# Patient Record
Sex: Male | Born: 1955 | Race: Black or African American | Hispanic: No | Smoking: Current every day smoker
Health system: Southern US, Community
[De-identification: ages and names within clinical notes are randomized; demographics above are authoritative.]

## PROBLEM LIST (undated history)

## (undated) DIAGNOSIS — I10 Essential (primary) hypertension: Secondary | ICD-10-CM

## (undated) DIAGNOSIS — I639 Cerebral infarction, unspecified: Secondary | ICD-10-CM

---

## 2009-11-08 ENCOUNTER — Ambulatory Visit: Payer: Self-pay | Admitting: Cardiology

## 2009-11-08 ENCOUNTER — Inpatient Hospital Stay (HOSPITAL_COMMUNITY)
Admission: EM | Admit: 2009-11-08 | Discharge: 2009-11-10 | Payer: Self-pay | Source: Home / Self Care | Admitting: Emergency Medicine

## 2009-11-10 ENCOUNTER — Encounter (INDEPENDENT_AMBULATORY_CARE_PROVIDER_SITE_OTHER): Payer: Self-pay | Admitting: Internal Medicine

## 2010-08-30 LAB — DIFFERENTIAL
Basophils Absolute: 0.1 10*3/uL (ref 0.0–0.1)
Basophils Relative: 1 % (ref 0–1)
Basophils Relative: 1 % (ref 0–1)
Eosinophils Absolute: 0.1 10*3/uL (ref 0.0–0.7)
Eosinophils Relative: 1 % (ref 0–5)
Lymphocytes Relative: 27 % (ref 12–46)
Lymphs Abs: 3.1 10*3/uL (ref 0.7–4.0)
Monocytes Absolute: 0.5 10*3/uL (ref 0.1–1.0)
Monocytes Absolute: 0.6 10*3/uL (ref 0.1–1.0)
Monocytes Relative: 6 % (ref 3–12)
Neutrophils Relative %: 67 % (ref 43–77)
Neutrophils Relative %: 70 % (ref 43–77)

## 2010-08-30 LAB — CBC
HCT: 45.9 % (ref 39.0–52.0)
MCHC: 32.6 g/dL (ref 30.0–36.0)
MCHC: 33.2 g/dL (ref 30.0–36.0)
Platelets: 148 10*3/uL — ABNORMAL LOW (ref 150–400)
RBC: 5.21 MIL/uL (ref 4.22–5.81)
WBC: 11.7 10*3/uL — ABNORMAL HIGH (ref 4.0–10.5)
WBC: 8.8 10*3/uL (ref 4.0–10.5)

## 2010-08-30 LAB — BASIC METABOLIC PANEL
BUN: 14 mg/dL (ref 6–23)
BUN: 15 mg/dL (ref 6–23)
BUN: 16 mg/dL (ref 6–23)
CO2: 28 mEq/L (ref 19–32)
CO2: 29 mEq/L (ref 19–32)
CO2: 30 mEq/L (ref 19–32)
Calcium: 9.6 mg/dL (ref 8.4–10.5)
Calcium: 9.7 mg/dL (ref 8.4–10.5)
Chloride: 102 mEq/L (ref 96–112)
Chloride: 103 mEq/L (ref 96–112)
Chloride: 104 mEq/L (ref 96–112)
Creatinine, Ser: 1.42 mg/dL (ref 0.4–1.5)
Creatinine, Ser: 1.43 mg/dL (ref 0.4–1.5)
Creatinine, Ser: 1.55 mg/dL — ABNORMAL HIGH (ref 0.4–1.5)
GFR calc Af Amer: 57 mL/min — ABNORMAL LOW (ref >60)
GFR calc Af Amer: >60 mL/min (ref >60)
GFR calc Af Amer: >60 mL/min (ref >60)
GFR calc non Af Amer: 47 mL/min — ABNORMAL LOW (ref >60)
GFR calc non Af Amer: 51 mL/min — ABNORMAL LOW (ref >60)
GFR calc non Af Amer: 52 mL/min — ABNORMAL LOW (ref >60)
Glucose, Bld: 107 mg/dL — ABNORMAL HIGH (ref 70–99)
Glucose, Bld: 109 mg/dL — ABNORMAL HIGH (ref 70–99)
Glucose, Bld: 114 mg/dL — ABNORMAL HIGH (ref 70–99)
Potassium: 3.8 mEq/L (ref 3.5–5.1)
Potassium: 4 mEq/L (ref 3.5–5.1)
Sodium: 138 mEq/L (ref 135–145)
Sodium: 138 mEq/L (ref 135–145)

## 2010-08-30 LAB — CARDIAC PANEL(CRET KIN+CKTOT+MB+TROPI)
CK, MB: 1.6 ng/mL (ref 0.3–4.0)
Total CK: 168 U/L (ref 7–232)
Troponin I: 0.02 ng/mL (ref 0.00–0.06)

## 2010-08-30 LAB — LIPID PANEL
HDL: 34 mg/dL — ABNORMAL LOW (ref >39)
Total CHOL/HDL Ratio: 4.4 RATIO
Triglycerides: 166 mg/dL — ABNORMAL HIGH (ref <150)

## 2010-08-30 LAB — HOMOCYSTEINE: Homocysteine: 64 umol/L — ABNORMAL HIGH (ref 4.0–15.4)

## 2010-08-30 LAB — APTT: aPTT: 27 seconds (ref 24–37)

## 2013-09-16 ENCOUNTER — Other Ambulatory Visit (HOSPITAL_COMMUNITY): Payer: Self-pay | Admitting: Family Medicine

## 2013-09-16 DIAGNOSIS — Q539 Undescended testicle, unspecified: Secondary | ICD-10-CM

## 2013-09-19 ENCOUNTER — Other Ambulatory Visit (HOSPITAL_COMMUNITY): Payer: Self-pay | Admitting: Family Medicine

## 2013-09-19 ENCOUNTER — Ambulatory Visit (HOSPITAL_COMMUNITY)
Admission: RE | Admit: 2013-09-19 | Discharge: 2013-09-19 | Disposition: A | Discharge status: Home / Self Care | Payer: BC Managed Care – PPO | Source: Ambulatory Visit | Attending: Family Medicine | Admitting: Family Medicine

## 2013-09-19 DIAGNOSIS — Q539 Undescended testicle, unspecified: Secondary | ICD-10-CM

## 2013-09-19 DIAGNOSIS — N508 Other specified disorders of male genital organs: Secondary | ICD-10-CM | POA: Insufficient documentation

## 2014-06-06 ENCOUNTER — Encounter (HOSPITAL_COMMUNITY): Payer: Self-pay | Admitting: *Deleted

## 2014-06-06 ENCOUNTER — Emergency Department (HOSPITAL_COMMUNITY)
Admission: EM | Admit: 2014-06-06 | Discharge: 2014-06-06 | Disposition: A | Discharge status: Home / Self Care | Payer: BC Managed Care – PPO | Attending: Emergency Medicine | Admitting: Emergency Medicine

## 2014-06-06 DIAGNOSIS — I10 Essential (primary) hypertension: Secondary | ICD-10-CM | POA: Diagnosis not present

## 2014-06-06 DIAGNOSIS — R062 Wheezing: Secondary | ICD-10-CM | POA: Insufficient documentation

## 2014-06-06 DIAGNOSIS — R109 Unspecified abdominal pain: Secondary | ICD-10-CM | POA: Insufficient documentation

## 2014-06-06 DIAGNOSIS — Z720 Tobacco use: Secondary | ICD-10-CM | POA: Diagnosis not present

## 2014-06-06 HISTORY — DX: Essential (primary) hypertension: I10

## 2014-06-06 LAB — URINALYSIS, ROUTINE W REFLEX MICROSCOPIC
Bilirubin Urine: NEGATIVE
GLUCOSE, UA: NEGATIVE mg/dL
Hgb urine dipstick: NEGATIVE
KETONES UR: NEGATIVE mg/dL
NITRITE: NEGATIVE
PH: 5.5 (ref 5.0–8.0)
PROTEIN: NEGATIVE mg/dL
SPECIFIC GRAVITY, URINE: 1.025 (ref 1.005–1.030)
UROBILINOGEN UA: 0.2 mg/dL (ref 0.0–1.0)

## 2014-06-06 LAB — URINE MICROSCOPIC-ADD ON

## 2014-06-06 MED ORDER — OXYCODONE-ACETAMINOPHEN 5-325 MG PO TABS
1.0000 | ORAL_TABLET | Freq: Once | ORAL | Status: AC
  Administered 2014-06-06: 1 via ORAL
  Filled 2014-06-06: qty 1

## 2014-06-06 MED ORDER — OXYCODONE-ACETAMINOPHEN 5-325 MG PO TABS: 1.0000 | ORAL_TABLET | Freq: Four times a day (QID) | ORAL | Status: DC | PRN

## 2014-06-06 NOTE — Discharge Instructions (Signed)
Flank Pain Flank pain refers to pain that is located on the side of the body between the upper abdomen and the back. The pain may occur over a short period of time (acute) or may be long-term or reoccurring (chronic). It may be mild or severe. Flank pain can be caused by many things. CAUSES  Some of the more common causes of flank pain include:  Muscle strains.   Muscle spasms.   A disease of your spine (vertebral disk disease).   A lung infection (pneumonia).   Fluid around your lungs (pulmonary edema).   A kidney infection.   Kidney stones.   A very painful skin rash caused by the chickenpox virus (shingles).   Gallbladder disease.  [place] care will depend on the cause of your pain. In general,  Rest as directed by your caregiver.  Drink enough fluids to keep your urine clear or pale yellow.  Only take over-the-counter or prescription medicines as directed by your caregiver. Some medicines may help relieve the pain.  Tell your caregiver about any changes in your pain.  Follow up with your caregiver as directed. SEEK IMMEDIATE MEDICAL CARE IF:   Your pain is not controlled with medicine.   You have new or worsening symptoms.  Your pain increases.   You have abdominal pain.   You have shortness of breath.   You have persistent nausea or vomiting.   You have swelling in your abdomen.   You feel faint or pass out.   You have blood in your urine.  You have a fever or persistent symptoms for more than 2-3 days.  You have a fever and your symptoms suddenly get worse. MAKE SURE YOU:   Understand these instructions.  Will watch your condition.  Will get help right away if you are not doing well or get worse. Document Released: 07/21/2005 Document Revised: 02/22/2012 Document Reviewed: 01/12/2012 [facility] Patient Information 2015 [place], [place]. This information is not intended to replace advice given to you by your  health care provider. Make sure you discuss any questions you have with your health care provider.  Hypertension Hypertension, commonly called high blood pressure, is when the force of blood pumping through your arteries is too strong. Your arteries are the blood vessels that carry blood from your heart throughout your body. A blood pressure reading consists of a higher number over a lower number, such as 110/72. The higher number (systolic) is the pressure inside your arteries when your heart pumps. The lower number (diastolic) is the pressure inside your arteries when your heart relaxes. Ideally you want your blood pressure below 120/80. Hypertension forces your heart to work harder to pump blood. Your arteries may become narrow or stiff. Having hypertension puts you at risk for heart disease, stroke, and other problems.  RISK FACTORS Some risk factors for high blood pressure are controllable. Others are not.  Risk factors you cannot control include:   Race. You may be at higher risk if you are African American.  Age. Risk increases with age.  Gender. Men are at higher risk than women before age 82 years. After age 66, women are at higher risk than men. Risk factors you can control include:  Not getting enough exercise or physical activity.  Being overweight.  Getting too much fat, sugar, calories, or salt in your diet.  Drinking too much alcohol. SIGNS AND SYMPTOMS Hypertension does not usually cause signs or symptoms. Extremely high blood pressure (hypertensive crisis) may  cause headache, anxiety, shortness of breath, and nosebleed. DIAGNOSIS  To check if you have hypertension, your health care provider will measure your blood pressure while you are seated, with your arm held at the level of your heart. It should be measured at least twice using the same arm. Certain conditions can cause a difference in blood pressure between your right and left arms. A blood pressure reading that is  higher than normal on one occasion does not mean that you need treatment. If one blood pressure reading is high, ask your health care provider about having it checked again. TREATMENT  Treating high blood pressure includes making lifestyle changes and possibly taking medicine. Living a healthy lifestyle can help lower high blood pressure. You may need to change some of your habits. Lifestyle changes may include:  Following the DASH diet. This diet is high in fruits, vegetables, and whole grains. It is low in salt, red meat, and added sugars.  Getting at least 2 hours of brisk physical activity every week.  Losing weight if necessary.  Not smoking.  Limiting alcoholic beverages.  Learning ways to reduce stress. If lifestyle changes are not enough to get your blood pressure under control, your health care provider may prescribe medicine. You may need to take more than one. Work closely with your health care provider to understand the risks and benefits. HOME CARE INSTRUCTIONS  Have your blood pressure rechecked as directed by your health care provider.   Take medicines only as directed by your health care provider. Follow the directions carefully. Blood pressure medicines must be taken as prescribed. The medicine does not work as well when you skip doses. Skipping doses also puts you at risk for problems.   Do not smoke.   Monitor your blood pressure at home as directed by your health care provider. SEEK MEDICAL CARE IF:   You think you are having a reaction to medicines taken.  You have recurrent headaches or feel dizzy.  You have swelling in your ankles.  You have trouble with your vision. SEEK IMMEDIATE MEDICAL CARE IF:  You develop a severe headache or confusion.  You have unusual weakness, numbness, or feel faint.  You have severe chest or abdominal pain.  You vomit repeatedly.  You have trouble breathing. MAKE SURE YOU:   Understand these  instructions.  Will watch your condition.  Will get help right away if you are not doing well or get worse. Document Released: 05/30/2005 Document Revised: 10/14/2013 Document Reviewed: 03/22/2013 [facility] Patient Information 2015 [place], [place]. This information is not intended to replace advice given to you by your health care provider. Make sure you discuss any questions you have with your health care provider.

## 2014-06-06 NOTE — ED Provider Notes (Signed)
CSN: [id number]     Arrival date & time 06/06/14  1932 History  This chart was scribed for [employer]. Alvino Chapel, MD by Edison Simon, ED Scribe. This patient was seen in room APA03/APA03 and the patient's care was started at 8:06 PM.    Chief Complaint  Patient presents with  . Flank Pain   The history is provided by the patient. No language interpreter was used.    HPI Comments: Jeffrey Carney is a 58 y.o. male who presents to the Emergency Department complaining of sharp pain to his right flank when rising to a standing position with onset yesterday. He states he has been working on a car and has been doing a lot of twisting. He states he does not have any pain while standing here, but can make it hurt with twisting or standing up. He states it does not hurt every time he stands. He states he smokes 1/2 ppd. He denies nausea, vomiting, fever, difficulty urinating, dysuria, lightheadedness, dizziness, rash, or constipation, SOB, or cough.   Past Medical History  Diagnosis Date  . Hypertension    History reviewed. No pertinent past surgical history. History reviewed. No pertinent family history. History  Substance Use Topics  . Smoking status: Current Every Day Smoker    Types: Cigarettes  . Smokeless tobacco: Not on file  . Alcohol Use: Yes     Comment: "sometimes"    Review of Systems  Constitutional: Negative for fever.  Respiratory: Negative for cough and shortness of breath.   Gastrointestinal: Negative for nausea, vomiting and constipation.  Genitourinary: Positive for flank pain. Negative for dysuria and difficulty urinating.  Skin: Negative for rash.  Neurological: Negative for dizziness and light-headedness.  All other systems reviewed and are negative.     Allergies  Review of patient's allergies indicates no known allergies.  Home Medications   Prior to Admission medications   Medication Sig Start Date End Date Taking? Authorizing Provider  oxyCODONE-acetaminophen  (PERCOCET/ROXICET) 5-325 MG per tablet Take 1-2 tablets by mouth every 6 (six) hours as needed for severe pain. 06/06/14   Jasper Riling. Gerald Kuehl, MD   BP 187/115 mmHg  Pulse 57  Temp(Src) 98.4 F (36.9 C) (Oral)  Resp 16  Ht 5\' 10"  (1.778 m)  Wt 175 lb (79.379 kg)  BMI 25.11 kg/m2  SpO2 98% Physical Exam  Constitutional: He is oriented to person, place, and time. He appears well-developed and well-nourished.  HENT:  Head: Normocephalic and atraumatic.  Eyes: Conjunctivae are normal.  Neck: Normal range of motion. Neck supple.  Pulmonary/Chest: Effort normal. He has wheezes (scattered wheezes diffusely).  Abdominal: Soft. He exhibits no distension. There is no tenderness. There is no CVA tenderness.  No pulsatile mass  Musculoskeletal: Normal range of motion. He exhibits no edema.  Mild tenderness over the lateral very low ribs on the right side No crepitance No deformity No peripheral edema  Neurological: He is alert and oriented to person, place, and time.  Skin: Skin is warm and dry. No rash noted.  Psychiatric: He has a normal mood and affect.  Nursing note and vitals reviewed.   ED Course  Procedures (including critical care time)   COORDINATION OF CARE: 8:11 PM Discussed treatment plan with patient at beside, the patient agrees with the plan and has no further questions at this time.   Labs Review Labs Reviewed  URINALYSIS, ROUTINE W REFLEX MICROSCOPIC - Abnormal; Notable for the following:    Leukocytes, UA TRACE (*)  All other components within normal limits  URINE MICROSCOPIC-ADD ON - Abnormal; Notable for the following:    Bacteria, UA FEW (*)    All other components within normal limits  URINE CULTURE    Imaging Review No results found.   EKG Interpretation None      MDM   Final diagnoses:  Flank pain  Essential hypertension   Patient with flank pain. Likely musculoskeletal. Has been working on cars. Urine shows few white cells and bacteria.  No dysuria. Will treat with pain medicine only. Culture sent. Doubt intra-abdominal pathology.  I personally performed the services described in this documentation, which was scribed in my presence. The recorded information has been reviewed and is accurate.     Jasper Riling. Alvino Chapel, MD 06/06/14 [phone]

## 2014-06-06 NOTE — ED Notes (Signed)
Pt with right flank pain since yesterday morning, worse with standing up per pt, denies N/V/D or fever

## 2014-06-09 LAB — URINE CULTURE

## 2015-11-20 ENCOUNTER — Encounter (HOSPITAL_COMMUNITY): Payer: Self-pay | Admitting: Emergency Medicine

## 2015-11-20 ENCOUNTER — Emergency Department (HOSPITAL_COMMUNITY)
Admission: EM | Admit: 2015-11-20 | Discharge: 2015-11-20 | Disposition: A | Discharge status: Home / Self Care | Payer: Self-pay | Attending: Emergency Medicine | Admitting: Emergency Medicine

## 2015-11-20 DIAGNOSIS — I10 Essential (primary) hypertension: Secondary | ICD-10-CM | POA: Insufficient documentation

## 2015-11-20 DIAGNOSIS — I159 Secondary hypertension, unspecified: Secondary | ICD-10-CM

## 2015-11-20 DIAGNOSIS — F17210 Nicotine dependence, cigarettes, uncomplicated: Secondary | ICD-10-CM | POA: Insufficient documentation

## 2015-11-20 LAB — URINALYSIS, ROUTINE W REFLEX MICROSCOPIC
Bilirubin Urine: NEGATIVE
Glucose, UA: NEGATIVE mg/dL
Ketones, ur: NEGATIVE mg/dL
Leukocytes, UA: NEGATIVE
Nitrite: NEGATIVE
Protein, ur: 100 mg/dL — AB
Specific Gravity, Urine: 1.018 (ref 1.005–1.030)
pH: 6 (ref 5.0–8.0)

## 2015-11-20 LAB — CBC
HCT: 46.1 % (ref 39.0–52.0)
Hemoglobin: 15.8 g/dL (ref 13.0–17.0)
MCH: 28.7 pg (ref 26.0–34.0)
MCHC: 34.3 g/dL (ref 30.0–36.0)
MCV: 83.8 fL (ref 78.0–100.0)
PLATELETS: 159 10*3/uL (ref 150–400)
RBC: 5.5 MIL/uL (ref 4.22–5.81)
RDW: 14.1 % (ref 11.5–15.5)
WBC: 9.1 10*3/uL (ref 4.0–10.5)

## 2015-11-20 LAB — URINE MICROSCOPIC-ADD ON: WBC, UA: NONE SEEN WBC/hpf (ref 0–5)

## 2015-11-20 LAB — BASIC METABOLIC PANEL
Anion gap: 8 (ref 5–15)
BUN: 18 mg/dL (ref 6–20)
CHLORIDE: 103 mmol/L (ref 101–111)
CO2: 25 mmol/L (ref 22–32)
Calcium: 9.1 mg/dL (ref 8.9–10.3)
Creatinine, Ser: 1.63 mg/dL — ABNORMAL HIGH (ref 0.61–1.24)
GFR calc Af Amer: 51 mL/min — ABNORMAL LOW (ref >60)
GFR calc non Af Amer: 44 mL/min — ABNORMAL LOW (ref >60)
Glucose, Bld: 98 mg/dL (ref 65–99)
POTASSIUM: 3.7 mmol/L (ref 3.5–5.1)
SODIUM: 136 mmol/L (ref 135–145)

## 2015-11-20 LAB — CBG MONITORING, ED: Glucose-Capillary: 107 mg/dL — ABNORMAL HIGH (ref 65–99)

## 2015-11-20 MED ORDER — HYDRALAZINE HCL 20 MG/ML IJ SOLN
5.0000 mg | Freq: Once | INTRAMUSCULAR | Status: AC
  Administered 2015-11-20: 5 mg via INTRAVENOUS
  Filled 2015-11-20: qty 1

## 2015-11-20 MED ORDER — CLONIDINE HCL 0.1 MG PO TABS
0.1000 mg | ORAL_TABLET | ORAL | Status: DC | PRN
  Administered 2015-11-20 (×3): 0.1 mg via ORAL
  Filled 2015-11-20 (×3): qty 1

## 2015-11-20 MED ORDER — HYDRALAZINE HCL 25 MG PO TABS: 25.0000 mg | ORAL_TABLET | Freq: Three times a day (TID) | ORAL | Status: DC

## 2015-11-20 MED ORDER — CLONIDINE HCL 0.1 MG PO TABS
0.2000 mg | ORAL_TABLET | Freq: Once | ORAL | Status: AC
  Administered 2015-11-20: 0.2 mg via ORAL
  Filled 2015-11-20: qty 2

## 2015-11-20 NOTE — ED Provider Notes (Signed)
CSN: [id number]     Arrival date & time 11/20/15  [id number] History   First MD Initiated Contact with Patient 11/20/15 1005     Chief Complaint  Patient presents with  . Dizziness      HPI  Expand All Collapse All   Pt c/o dizziness since last night. Reports he has not been taking his BP medication for the past month. Pt denies headache or chest pain.        Past Medical History  Diagnosis Date  . Hypertension    History reviewed. No pertinent past surgical history. No family history on file. Social History  Substance Use Topics  . Smoking status: Current Every Day Smoker    Types: Cigarettes  . Smokeless tobacco: None  . Alcohol Use: Yes     Comment: "sometimes"    Review of Systems  All other systems reviewed and are negative  Allergies  Review of patient's allergies indicates no known allergies.  Home Medications   Prior to Admission medications   Medication Sig Start Date End Date Taking? Authorizing Provider  hydrALAZINE (APRESOLINE) 25 MG tablet Take 1 tablet (25 mg total) by mouth 3 (three) times daily. 11/20/15   Leonard Schwartz, MD   BP 200/133 mmHg  Pulse 58  Temp(Src) 98.5 F (36.9 C) (Oral)  Resp 16  SpO2 98% Physical Exam Physical Exam  Nursing note and vitals reviewed. Constitutional: He is oriented to person, place, and time. He appears well-developed and well-nourished. No distress.  HENT:  Head: Normocephalic and atraumatic.  Eyes: Pupils are equal, round, and reactive to light.  Neck: Normal range of motion.  Cardiovascular: Normal rate and intact distal pulses.   Pulmonary/Chest: No respiratory distress.  Abdominal: Normal appearance. He exhibits no distension.  Musculoskeletal: Normal range of motion.  Neurological: He is alert and oriented to person, place, and time. No cranial nerve deficit.  Skin: Skin is warm and dry. No rash noted.  Psychiatric: He has a normal mood and affect. His behavior is normal.   ED Course  Procedures  (including critical care time) Medications  cloNIDine (CATAPRES) tablet 0.1 mg (0.1 mg Oral Given 11/20/15 1244)  cloNIDine (CATAPRES) tablet 0.2 mg (0.2 mg Oral Given 11/20/15 1040)  hydrALAZINE (APRESOLINE) injection 5 mg (5 mg Intravenous Given 11/20/15 1346)    Labs Review Labs Reviewed  BASIC METABOLIC PANEL - Abnormal; Notable for the following:    Creatinine, Ser 1.63 (*)    GFR calc non Af Amer 44 (*)    GFR calc Af Amer 51 (*)    All other components within normal limits  URINALYSIS, ROUTINE W REFLEX MICROSCOPIC (NOT AT [facility]) - Abnormal; Notable for the following:    Hgb urine dipstick TRACE (*)    Protein, ur 100 (*)    All other components within normal limits  URINE MICROSCOPIC-ADD ON - Abnormal; Notable for the following:    Squamous Epithelial / LPF 0-5 (*)    Bacteria, UA RARE (*)    Casts GRANULAR CAST (*)    All other components within normal limits  CBG MONITORING, ED - Abnormal; Notable for the following:    Glucose-Capillary 107 (*)    All other components within normal limits  CBC    Imaging Review No results found. I have personally reviewed and evaluated these images and lab results as part of my medical decision-making.   EKG Interpretation   Date/Time:  Friday November 20 2015 10:07:14 EDT Ventricular Rate:  73 PR Interval:  149 QRS Duration: 82 QT Interval:  396 QTC Calculation: 436 R Axis:   1 Text Interpretation:  Sinus rhythm Consider left ventricular hypertrophy  Abnormal ekg Confirmed by Audie Pinto  MD, Mckinlee Dunk ([id number]) on 11/20/2015 10:42:19  AM Also confirmed by Audie Pinto  MD, Bari Leib [phone]), editor [place] CT, Leda Gauze  [phone])  on 11/20/2015 10:51:02 AM     Patient is asymptomatic with no headache no chest pain says his dizziness is gone now.  No shortness of breath.  Patient wants to go home and start back on his medication.  Blood pressure still elevated but he is asymptomatic and at this time his renal function is slightly elevated but he appears stable to  be discharge.  After treatment in the ED the patient feels back to baseline and wants to go home. MDM   Final diagnoses:  Secondary hypertension, unspecified        Leonard Schwartz, MD 11/20/15 1431

## 2015-11-20 NOTE — Discharge Instructions (Signed)
Hypertension Hypertension, commonly called high blood pressure, is when the force of blood pumping through your arteries is too strong. Your arteries are the blood vessels that carry blood from your heart throughout your body. A blood pressure reading consists of a higher number over a lower number, such as 110/72. The higher number (systolic) is the pressure inside your arteries when your heart pumps. The lower number (diastolic) is the pressure inside your arteries when your heart relaxes. Ideally you want your blood pressure below 120/80. Hypertension forces your heart to work harder to pump blood. Your arteries may become narrow or stiff. Having untreated or uncontrolled hypertension can cause heart attack, stroke, kidney disease, and other problems. RISK FACTORS Some risk factors for high blood pressure are controllable. Others are not.  Risk factors you cannot control include:   Race. You may be at higher risk if you are African American.  Age. Risk increases with age.  Gender. Men are at higher risk than women before age 45 years. After age 65, women are at higher risk than men. Risk factors you can control include:  Not getting enough exercise or physical activity.  Being overweight.  Getting too much fat, sugar, calories, or salt in your diet.  Drinking too much alcohol. SIGNS AND SYMPTOMS Hypertension does not usually cause signs or symptoms. Extremely high blood pressure (hypertensive crisis) may cause headache, anxiety, shortness of breath, and nosebleed. DIAGNOSIS To check if you have hypertension, your health care provider will measure your blood pressure while you are seated, with your arm held at the level of your heart. It should be measured at least twice using the same arm. Certain conditions can cause a difference in blood pressure between your right and left arms. A blood pressure reading that is higher than normal on one occasion does not mean that you need treatment. If  it is not clear whether you have high blood pressure, you may be asked to return on a different day to have your blood pressure checked again. Or, you may be asked to monitor your blood pressure at home for 1 or more weeks. TREATMENT Treating high blood pressure includes making lifestyle changes and possibly taking medicine. Living a healthy lifestyle can help lower high blood pressure. You may need to change some of your habits. Lifestyle changes may include:  Following the DASH diet. This diet is high in fruits, vegetables, and whole grains. It is low in salt, red meat, and added sugars.  Keep your sodium intake below 2,300 mg per day.  Getting at least 30-45 minutes of aerobic exercise at least 4 times per week.  Losing weight if necessary.  Not smoking.  Limiting alcoholic beverages.  Learning ways to reduce stress. Your health care provider may prescribe medicine if lifestyle changes are not enough to get your blood pressure under control, and if one of the following is true:  You are 18-59 years of age and your systolic blood pressure is above 140.  You are 60 years of age or older, and your systolic blood pressure is above 150.  Your diastolic blood pressure is above 90.  You have diabetes, and your systolic blood pressure is over 140 or your diastolic blood pressure is over 90.  You have kidney disease and your blood pressure is above 140/90.  You have heart disease and your blood pressure is above 140/90. Your personal target blood pressure may vary depending on your medical conditions, your age, and other factors. HOME CARE INSTRUCTIONS    Have your blood pressure rechecked as directed by your health care provider.   Take medicines only as directed by your health care provider. Follow the directions carefully. Blood pressure medicines must be taken as prescribed. The medicine does not work as well when you skip doses. Skipping doses also puts you at risk for  problems.  Do not smoke.   Monitor your blood pressure at home as directed by your health care provider. SEEK MEDICAL CARE IF:   You think you are having a reaction to medicines taken.  You have recurrent headaches or feel dizzy.  You have swelling in your ankles.  You have trouble with your vision. SEEK IMMEDIATE MEDICAL CARE IF:  You develop a severe headache or confusion.  You have unusual weakness, numbness, or feel faint.  You have severe chest or abdominal pain.  You vomit repeatedly.  You have trouble breathing. MAKE SURE YOU:   Understand these instructions.  Will watch your condition.  Will get help right away if you are not doing well or get worse.   This information is not intended to replace advice given to you by your health care provider. Make sure you discuss any questions you have with your health care provider.   Document Released: 05/30/2005 Document Revised: 10/14/2014 Document Reviewed: 03/22/2013 Elsevier Interactive Patient Education 2016 [employer]  

## 2015-11-20 NOTE — ED Notes (Signed)
Pt reports being unable to afford his medications which is why he has not gotten them refilled.

## 2015-11-20 NOTE — ED Notes (Signed)
Pt c/o dizziness since last night. Reports he has not been taking his BP medication for the past month. Pt denies headache or chest pain.

## 2016-05-09 ENCOUNTER — Emergency Department (HOSPITAL_COMMUNITY)
Admission: EM | Admit: 2016-05-09 | Discharge: 2016-05-09 | Disposition: A | Discharge status: Home / Self Care | Payer: Self-pay | Attending: Emergency Medicine | Admitting: Emergency Medicine

## 2016-05-09 ENCOUNTER — Encounter (HOSPITAL_COMMUNITY): Payer: Self-pay | Admitting: Emergency Medicine

## 2016-05-09 DIAGNOSIS — F17210 Nicotine dependence, cigarettes, uncomplicated: Secondary | ICD-10-CM | POA: Insufficient documentation

## 2016-05-09 DIAGNOSIS — I10 Essential (primary) hypertension: Secondary | ICD-10-CM | POA: Insufficient documentation

## 2016-05-09 MED ORDER — HYDRALAZINE HCL 25 MG PO TABS: 25.0000 mg | ORAL_TABLET | Freq: Three times a day (TID) | ORAL | 0 refills | Status: DC

## 2016-05-09 MED ORDER — CLONIDINE HCL 0.1 MG PO TABS
0.1000 mg | ORAL_TABLET | Freq: Once | ORAL | Status: AC
  Administered 2016-05-09: 0.1 mg via ORAL
  Filled 2016-05-09: qty 1

## 2016-05-09 MED ORDER — HYDRALAZINE HCL 25 MG PO TABS
25.0000 mg | ORAL_TABLET | Freq: Once | ORAL | Status: AC
  Administered 2016-05-09: 25 mg via ORAL
  Filled 2016-05-09: qty 1

## 2016-05-09 NOTE — ED Triage Notes (Signed)
Pt states he ran out of his blood pressure medication on Wed. Pt states he has not contacted his PCP.

## 2016-05-09 NOTE — ED Provider Notes (Signed)
[place] DEPT Provider Note   CSN: [id number] Arrival date & time: 05/09/16  1033     History   Chief Complaint Chief Complaint  Patient presents with  . Hypertension    HPI Jeffrey Carney is a 60 y.o. male.  HPI  Jeffrey Carney is a 60 y.o. male who presents to the Emergency Department for medication refill.   He states that he takes hydralazine TID for his hypertension.  He ran out of his medication 5 days ago.  He denies symptoms at this time.  He has a PCP, but has not contacted him to arrange follow-up.      Past Medical History:  Diagnosis Date  . Hypertension     There are no active problems to display for this patient.   History reviewed. No pertinent surgical history.     Home Medications    Prior to Admission medications   Medication Sig Start Date End Date Taking? Authorizing Provider  hydrALAZINE (APRESOLINE) 25 MG tablet Take 1 tablet (25 mg total) by mouth 3 (three) times daily. 11/20/15   Leonard Schwartz, MD    Family History Family History  Problem Relation Age of Onset  . Hypertension Mother   . Hypertension Father   . Cancer Brother     Social History Social History  Substance Use Topics  . Smoking status: Current Every Day Smoker    Types: Cigarettes  . Smokeless tobacco: Never Used  . Alcohol use Yes     Comment: "sometimes"     Allergies   Patient has no known allergies.   Review of Systems Review of Systems  Constitutional: Negative for appetite change, chills and fever.  Eyes: Negative for visual disturbance.  Respiratory: Negative for cough, chest tightness and shortness of breath.   Cardiovascular: Negative for chest pain and leg swelling.  Gastrointestinal: Negative for nausea and vomiting.  Musculoskeletal: Negative for arthralgias and neck pain.  Skin: Negative for rash.  Neurological: Negative for dizziness, weakness, numbness and headaches.  Psychiatric/Behavioral: Negative for confusion.     Physical  Exam Updated Vital Signs BP (!) 206/140 (BP Location: Right Arm)   Pulse 95   Temp 98 F (36.7 C) (Oral)   Resp 19   SpO2 100%   Physical Exam  Constitutional: He is oriented to person, place, and time. He appears well-developed and well-nourished. No distress.  HENT:  Head: Atraumatic.  Mouth/Throat: Oropharynx is clear and moist.  Eyes: EOM are normal. Pupils are equal, round, and reactive to light.  Neck: Normal range of motion. No JVD present. No tracheal deviation present.  Cardiovascular: Normal rate, regular rhythm and normal heart sounds.   No murmur heard. Pulmonary/Chest: Effort normal and breath sounds normal. No respiratory distress. He exhibits no tenderness.  Musculoskeletal: Normal range of motion. He exhibits no edema.  Neurological: He is alert and oriented to person, place, and time. Coordination normal.  Skin: Skin is warm and dry.  Psychiatric: He has a normal mood and affect.     ED Treatments / Results  Labs (all labs ordered are listed, but only abnormal results are displayed) Labs Reviewed - No data to display  EKG  EKG Interpretation None       Radiology No results found.  Procedures Procedures (including critical care time)  Medications Ordered in ED Medications  hydrALAZINE (APRESOLINE) tablet 25 mg (not administered)  cloNIDine (CATAPRES) tablet 0.1 mg (not administered)     Initial Impression / Assessment and Plan / ED Course  I have reviewed the triage vital signs and the nursing notes.  Pertinent labs & imaging results that were available during my care of the patient were reviewed by me and considered in my medical decision making (see chart for details).  Clinical Course    Pt well appearing , no distress, asymptomatic at this time.  Medications given here and discussed tx plan with Dr. Jilda Panda who also saw the patient.   Pt given hydralazine and clonidine here, BP tending down on recheck after medication.    Pt appears  stable for d/c.  I will refill his medication and he understands to call his PMD today to arrange f/u  Final Clinical Impressions(s) / ED Diagnoses   Final diagnoses:  Essential hypertension    New Prescriptions New Prescriptions   No medications on file     Jeffrey Carney 05/09/16 [place]    Jeffrey Morrison, MD 05/10/16 2155    Jeffrey Morrison, MD 05/10/16 2207

## 2016-05-09 NOTE — Discharge Instructions (Signed)
Its important to take your BP medication as directed every day.  Call Dr. Berdine Addison today to arrange a follow-up appt.  Return to ER for any worsening symtpoms.  You can also follow-up with the clinic listed if needed

## 2017-08-02 ENCOUNTER — Inpatient Hospital Stay (HOSPITAL_COMMUNITY)
Admission: EM | Admit: 2017-08-02 | Discharge: 2017-08-08 | DRG: 065 | Disposition: A | Discharge status: Home Health | Payer: Medicaid Other | Attending: Internal Medicine | Admitting: Internal Medicine

## 2017-08-02 ENCOUNTER — Encounter (HOSPITAL_COMMUNITY): Payer: Self-pay

## 2017-08-02 ENCOUNTER — Other Ambulatory Visit: Payer: Self-pay

## 2017-08-02 ENCOUNTER — Emergency Department (HOSPITAL_COMMUNITY): Payer: Medicaid Other

## 2017-08-02 DIAGNOSIS — Z23 Encounter for immunization: Secondary | ICD-10-CM | POA: Diagnosis not present

## 2017-08-02 DIAGNOSIS — H538 Other visual disturbances: Secondary | ICD-10-CM | POA: Diagnosis present

## 2017-08-02 DIAGNOSIS — G3184 Mild cognitive impairment, so stated: Secondary | ICD-10-CM | POA: Diagnosis present

## 2017-08-02 DIAGNOSIS — I160 Hypertensive urgency: Secondary | ICD-10-CM | POA: Diagnosis not present

## 2017-08-02 DIAGNOSIS — I513 Intracardiac thrombosis, not elsewhere classified: Secondary | ICD-10-CM | POA: Diagnosis present

## 2017-08-02 DIAGNOSIS — I129 Hypertensive chronic kidney disease with stage 1 through stage 4 chronic kidney disease, or unspecified chronic kidney disease: Secondary | ICD-10-CM | POA: Diagnosis present

## 2017-08-02 DIAGNOSIS — Z8249 Family history of ischemic heart disease and other diseases of the circulatory system: Secondary | ICD-10-CM

## 2017-08-02 DIAGNOSIS — N390 Urinary tract infection, site not specified: Secondary | ICD-10-CM | POA: Diagnosis not present

## 2017-08-02 DIAGNOSIS — B961 Klebsiella pneumoniae [K. pneumoniae] as the cause of diseases classified elsewhere: Secondary | ICD-10-CM | POA: Diagnosis present

## 2017-08-02 DIAGNOSIS — R260 Ataxic gait: Secondary | ICD-10-CM | POA: Diagnosis present

## 2017-08-02 DIAGNOSIS — N179 Acute kidney failure, unspecified: Secondary | ICD-10-CM | POA: Diagnosis present

## 2017-08-02 DIAGNOSIS — Z8673 Personal history of transient ischemic attack (TIA), and cerebral infarction without residual deficits: Secondary | ICD-10-CM

## 2017-08-02 DIAGNOSIS — Z9114 Patient's other noncompliance with medication regimen: Secondary | ICD-10-CM

## 2017-08-02 DIAGNOSIS — I639 Cerebral infarction, unspecified: Secondary | ICD-10-CM | POA: Diagnosis not present

## 2017-08-02 DIAGNOSIS — R29701 NIHSS score 1: Secondary | ICD-10-CM | POA: Diagnosis present

## 2017-08-02 DIAGNOSIS — R739 Hyperglycemia, unspecified: Secondary | ICD-10-CM | POA: Diagnosis present

## 2017-08-02 DIAGNOSIS — F17210 Nicotine dependence, cigarettes, uncomplicated: Secondary | ICD-10-CM | POA: Diagnosis present

## 2017-08-02 DIAGNOSIS — Z1611 Resistance to penicillins: Secondary | ICD-10-CM | POA: Diagnosis present

## 2017-08-02 DIAGNOSIS — I63031 Cerebral infarction due to thrombosis of right carotid artery: Secondary | ICD-10-CM | POA: Diagnosis present

## 2017-08-02 DIAGNOSIS — R42 Dizziness and giddiness: Secondary | ICD-10-CM

## 2017-08-02 DIAGNOSIS — R471 Dysarthria and anarthria: Secondary | ICD-10-CM | POA: Diagnosis present

## 2017-08-02 DIAGNOSIS — B9689 Other specified bacterial agents as the cause of diseases classified elsewhere: Secondary | ICD-10-CM | POA: Diagnosis present

## 2017-08-02 DIAGNOSIS — R29810 Facial weakness: Secondary | ICD-10-CM | POA: Diagnosis present

## 2017-08-02 DIAGNOSIS — I161 Hypertensive emergency: Secondary | ICD-10-CM | POA: Diagnosis present

## 2017-08-02 DIAGNOSIS — T465X6A Underdosing of other antihypertensive drugs, initial encounter: Secondary | ICD-10-CM | POA: Diagnosis present

## 2017-08-02 DIAGNOSIS — N183 Chronic kidney disease, stage 3 (moderate): Secondary | ICD-10-CM | POA: Diagnosis present

## 2017-08-02 DIAGNOSIS — Z7902 Long term (current) use of antithrombotics/antiplatelets: Secondary | ICD-10-CM | POA: Diagnosis not present

## 2017-08-02 HISTORY — DX: Cerebral infarction, unspecified: I63.9

## 2017-08-02 LAB — CBC WITH DIFFERENTIAL/PLATELET
BASOS ABS: 0 10*3/uL (ref 0.0–0.1)
Basophils Relative: 0 %
EOS PCT: 2 %
Eosinophils Absolute: 0.1 10*3/uL (ref 0.0–0.7)
HCT: 40.5 % (ref 39.0–52.0)
Hemoglobin: 12.8 g/dL — ABNORMAL LOW (ref 13.0–17.0)
LYMPHS PCT: 37 %
Lymphs Abs: 2.8 10*3/uL (ref 0.7–4.0)
MCH: 27.2 pg (ref 26.0–34.0)
MCHC: 31.6 g/dL (ref 30.0–36.0)
MCV: 86 fL (ref 78.0–100.0)
Monocytes Absolute: 0.6 10*3/uL (ref 0.1–1.0)
Monocytes Relative: 8 %
NEUTROS PCT: 53 %
Neutro Abs: 4 10*3/uL (ref 1.7–7.7)
PLATELETS: 145 10*3/uL — AB (ref 150–400)
RBC: 4.71 MIL/uL (ref 4.22–5.81)
RDW: 14.3 % (ref 11.5–15.5)
WBC: 7.6 10*3/uL (ref 4.0–10.5)

## 2017-08-02 LAB — COMPREHENSIVE METABOLIC PANEL
ALT: 8 U/L — AB (ref 17–63)
AST: 16 U/L (ref 15–41)
Albumin: 3.6 g/dL (ref 3.5–5.0)
Alkaline Phosphatase: 61 U/L (ref 38–126)
Anion gap: 9 (ref 5–15)
BUN: 19 mg/dL (ref 6–20)
CHLORIDE: 101 mmol/L (ref 101–111)
CO2: 28 mmol/L (ref 22–32)
CREATININE: 2.18 mg/dL — AB (ref 0.61–1.24)
Calcium: 9.4 mg/dL (ref 8.9–10.3)
GFR calc Af Amer: 35 mL/min — ABNORMAL LOW (ref >60)
GFR calc non Af Amer: 30 mL/min — ABNORMAL LOW (ref >60)
Glucose, Bld: 116 mg/dL — ABNORMAL HIGH (ref 65–99)
Potassium: 3.5 mmol/L (ref 3.5–5.1)
Sodium: 138 mmol/L (ref 135–145)
Total Bilirubin: 0.6 mg/dL (ref 0.3–1.2)
Total Protein: 8 g/dL (ref 6.5–8.1)

## 2017-08-02 MED ORDER — NITROGLYCERIN 2 % TD OINT: 1.0000 [in_us] | TOPICAL_OINTMENT | Freq: Three times a day (TID) | TRANSDERMAL | Status: DC

## 2017-08-02 MED ORDER — HYDRALAZINE HCL 20 MG/ML IJ SOLN
5.0000 mg | Freq: Once | INTRAMUSCULAR | Status: AC
  Administered 2017-08-02: 5 mg via INTRAVENOUS
  Filled 2017-08-02: qty 1

## 2017-08-02 MED ORDER — LABETALOL HCL 5 MG/ML IV SOLN
20.0000 mg | Freq: Once | INTRAVENOUS | Status: AC
  Administered 2017-08-02: 20 mg via INTRAVENOUS
  Filled 2017-08-02: qty 4

## 2017-08-02 MED ORDER — NITROGLYCERIN IN D5W 200-5 MCG/ML-% IV SOLN
0.0000 ug/min | INTRAVENOUS | Status: DC
Start: 2017-08-02 — End: 2017-08-04
  Administered 2017-08-02: 5 ug/min via INTRAVENOUS
  Filled 2017-08-02: qty 250

## 2017-08-02 MED ORDER — HYDRALAZINE HCL 20 MG/ML IJ SOLN
10.0000 mg | Freq: Four times a day (QID) | INTRAMUSCULAR | Status: DC | PRN
  Administered 2017-08-03 – 2017-08-04 (×2): 10 mg via INTRAVENOUS
  Filled 2017-08-02 (×3): qty 1

## 2017-08-02 MED ORDER — LABETALOL HCL 5 MG/ML IV SOLN
40.0000 mg | Freq: Once | INTRAVENOUS | Status: AC
  Administered 2017-08-02: 40 mg via INTRAVENOUS
  Filled 2017-08-02: qty 8

## 2017-08-02 MED ORDER — SODIUM CHLORIDE 0.9 % IV BOLUS (SEPSIS)
250.0000 mL | Freq: Once | INTRAVENOUS | Status: AC
  Administered 2017-08-02: 250 mL via INTRAVENOUS

## 2017-08-02 NOTE — ED Notes (Signed)
Pt sleeping. 

## 2017-08-02 NOTE — ED Notes (Signed)
Pt states to this nurse that this morning when he woke up around 7-8am, he felt dizzy, had blurred vision and was seeing black spots. Pt states he takes his BP meds as directed but is unable to say what meds he takes. Pt a&o at this time.

## 2017-08-02 NOTE — ED Provider Notes (Signed)
[facility] EMERGENCY DEPARTMENT Provider Note   CSN: [id number] Arrival date & time: 08/02/17  1735     History   Chief Complaint Chief Complaint  Patient presents with  . Hypertension    HPI Jeffrey Carney is a 62 y.o. male.  Patient states that he has been having dizziness all day today and his wife that he had some facial drooping earlier today.  Patient has poorly controlled blood pressure   The history is provided by the patient. No language interpreter was used.  Hypertension  This is a recurrent problem. The current episode started more than 1 week ago. The problem occurs constantly. The problem has not changed since onset.Pertinent negatives include no chest pain, no abdominal pain and no headaches. He has tried nothing for the symptoms. The treatment provided no relief.    Past Medical History:  Diagnosis Date  . Hypertension   . Stroke [facility])    TIA    Patient Active Problem List   Diagnosis Date Noted  . Dizziness 08/02/2017    History reviewed. No pertinent surgical history.     Home Medications    Prior to Admission medications   Medication Sig Start Date End Date Taking? Authorizing Provider  hydrALAZINE (APRESOLINE) 25 MG tablet Take 1 tablet (25 mg total) by mouth 3 (three) times daily. 05/09/16   Kem Parkinson, PA-C    Family History Family History  Problem Relation Age of Onset  . Hypertension Mother   . Hypertension Father   . Cancer Brother     Social History Social History   Tobacco Use  . Smoking status: Current Every Day Smoker    Types: Cigarettes  . Smokeless tobacco: Never Used  Substance Use Topics  . Alcohol use: Yes    Comment: "sometimes"  . Drug use: Yes    Types: Marijuana    Comment: 2 days ago     Allergies   Patient has no known allergies.   Review of Systems Review of Systems  Constitutional: Negative for appetite change and fatigue.  HENT: Negative for congestion, ear discharge and sinus pressure.     Eyes: Negative for discharge.  Respiratory: Negative for cough.   Cardiovascular: Negative for chest pain.  Gastrointestinal: Negative for abdominal pain and diarrhea.  Genitourinary: Negative for frequency and hematuria.  Musculoskeletal: Negative for back pain.  Skin: Negative for rash.  Neurological: Positive for dizziness. Negative for seizures and headaches.  Psychiatric/Behavioral: Negative for hallucinations.     Physical Exam Updated Vital Signs BP (!) 206/134   Pulse (!) 57   Temp 98.4 F (36.9 C) (Oral)   Resp 18   Ht 5\' 10"  (1.778 m)   Wt 77.1 kg (170 lb)   SpO2 100%   BMI 24.39 kg/m   Physical Exam  Constitutional: He is oriented to person, place, and time. He appears well-developed.  HENT:  Head: Normocephalic.  Eyes: Conjunctivae and EOM are normal. No scleral icterus.  Neck: Neck supple. No thyromegaly present.  Cardiovascular: Normal rate and regular rhythm. Exam reveals no gallop and no friction rub.  No murmur heard. Pulmonary/Chest: No stridor. He has no wheezes. He has no rales. He exhibits no tenderness.  Abdominal: He exhibits no distension. There is no tenderness. There is no rebound.  Musculoskeletal: Normal range of motion. He exhibits no edema.  Lymphadenopathy:    He has no cervical adenopathy.  Neurological: He is oriented to person, place, and time. He exhibits normal muscle tone. Coordination normal.  Skin: No rash noted. No erythema.  Psychiatric: He has a normal mood and affect. His behavior is normal.     ED Treatments / Results  Labs (all labs ordered are listed, but only abnormal results are displayed) Labs Reviewed  CBC WITH DIFFERENTIAL/PLATELET - Abnormal; Notable for the following components:      Result Value   Hemoglobin 12.8 (*)    Platelets 145 (*)    All other components within normal limits  COMPREHENSIVE METABOLIC PANEL - Abnormal; Notable for the following components:   Glucose, Bld 116 (*)    Creatinine, Ser 2.18  (*)    ALT 8 (*)    GFR calc non Af Amer 30 (*)    GFR calc Af Amer 35 (*)    All other components within normal limits    EKG  EKG Interpretation None       Radiology Ct Head Wo Contrast  Result Date: 08/02/2017 CLINICAL DATA:  Visual changes facial droop elevated BP EXAM: CT HEAD WITHOUT CONTRAST TECHNIQUE: Contiguous axial images were obtained from the base of the skull through the vertex without intravenous contrast. COMPARISON:  Brain MRI 11/10/2009, 11/08/2009 head CT FINDINGS: Brain: No acute territorial infarction, hemorrhage, or intracranial mass is seen. Moderate hypodensity in the bilateral white matter consistent with small vessel ischemic change, progressed since prior study. Multiple old appearing lacunar infarcts in the bilateral basal ganglia and thalamus, many of which are new compared to the prior CT. Moderate atrophy. Prominent ventricle size likely due to progression of atrophy Vascular: No hyperdense vessels.  No unexpected calcification Skull: Normal. Negative for fracture or focal lesion. Sinuses/Orbits: Mild mucosal thickening in the ethmoid sinuses. No acute orbital abnormality Other: None IMPRESSION: 1. No definite CT evidence for acute intracranial abnormality 2. Moderate small vessel ischemic changes of the white matter, progression since prior study. Numerous old appearing lacunar infarcts in the basal ganglia and thalamus, some of which are new compared to the prior CT 3. Atrophy Electronically Signed   By: Donavan Foil M.D.   On: 08/02/2017 19:19    Procedures Procedures (including critical care time)  Medications Ordered in ED Medications  hydrALAZINE (APRESOLINE) injection 5 mg (not administered)  sodium chloride 0.9 % bolus 250 mL (250 mLs Intravenous New Bag/Given 08/02/17 1842)  labetalol (NORMODYNE,TRANDATE) injection 20 mg (20 mg Intravenous Given 08/02/17 1842)  labetalol (NORMODYNE,TRANDATE) injection 40 mg (40 mg Intravenous Given 08/02/17 1947)      Initial Impression / Assessment and Plan / ED Course  I have reviewed the triage vital signs and the nursing notes.  Pertinent labs & imaging results that were available during my care of the patient were reviewed by me and considered in my medical decision making (see chart for details).     CRITICAL CARE Performed by: Milton Ferguson Total critical care time:35 minutes Critical care time was exclusive of separately billable procedures and treating other patients. Critical care was necessary to treat or prevent imminent or life-threatening deterioration. Critical care was time spent personally by me on the following activities: development of treatment plan with patient and/or surrogate as well as nursing, discussions with consultants, evaluation of patient's response to treatment, examination of patient, obtaining history from patient or surrogate, ordering and performing treatments and interventions, ordering and review of laboratory studies, ordering and review of radiographic studies, pulse oximetry and re-evaluation of patient's condition. Patient with hypertensive urgency and worsening renal function.  He will be admitted to medicine  Final Clinical Impressions(s) /  ED Diagnoses   Final diagnoses:  Hypertensive urgency    ED Discharge Orders    None       Milton Ferguson, MD 08/02/17 2047

## 2017-08-02 NOTE — ED Triage Notes (Signed)
Ems reports pt c/o visual changes since 0730 this morning.  Wife called ems because she noticed some facial droop.  When ems arrived they denied seeing any facial droop but pt still "seeing spots" and bp manually was 280/180 per ems.  Pt alert and oriented.  Denies any pain, weakness, or numbness.

## 2017-08-02 NOTE — ED Notes (Signed)
Pt's daughter is in the room with this pt and RN. The daughter questioned her dad as to whether or not he had been taking his BP meds. He advised his daughter that he was.

## 2017-08-02 NOTE — ED Notes (Signed)
Pt's daughter spoke with this RN outside of the room and said she was concerned that her father wasn't taking his BP meds. His daughter states that he has moved back in with his ex-wife and she isn't certain he is getting his meds.

## 2017-08-02 NOTE — H&P (Signed)
[facility] H&P   Patient Demographics:    Jeffrey Carney, is a 62 y.o. male  MRN: [id number]   DOB - [birth year]  Admit Date - 08/02/2017  Outpatient Primary MD for the patient is [name], MD  Referring MD/NP/PA:  Denton Meek  Outpatient Specialists:    Patient coming from: home  Chief Complaint  Patient presents with  . Hypertension      HPI:    Jeffrey Carney  is a 62 y.o. male, w hx of CVA, hypertension apparently c/o dizziness starting about 8am.  No vertigo.  Slight headache "frontal", slight blurred vision out of left eye.  Pt states that dizziness mostly subsided. Still has blurred vision. Pt denies any focal weakness, numbness, tingling, slurred speech.  Pt denies cp, palp, sob, lower ext edema. Pt states that he has not been taking his bp medication. Pt not a TPA candidate due to outside TPA window  In Ed,  CT brain IMPRESSION: 1. No definite CT evidence for acute intracranial abnormality 2. Moderate small vessel ischemic changes of the white matter, progression since prior study. Numerous old appearing lacunar infarcts in the basal ganglia and thalamus, some of which are new compared to the prior CT 3. Atrophy  Wbc 7.6, hgb 12.8, Plt 145  Na 138, K 3.5, Bun 19, Creatinine 2.18 Ast 16, Alt 8   Pt will be admitted for dizziness, r/o CVA, and for hypertensive urgency/emergency, and ARF.     Review of systems:    In addition to the HPI above,  No Fever-chills, No Headache, No changes with hearing.  No problems swallowing food or Liquids, No Chest pain, Cough or Shortness of Breath, No Abdominal pain, No Nausea or Vommitting, Bowel movements are regular, No Blood in stool or Urine, No dysuria, No new skin rashes or bruises, No new joints pains-aches,  No new weakness, tingling, numbness in any extremity, No recent weight gain or loss, No polyuria, polydypsia  or polyphagia, No significant Mental Stressors.  A full 10 point Review of Systems was done, except as stated above, all other Review of Systems were negative.   With Past History of the following :    Past Medical History:  Diagnosis Date  . Hypertension   . Stroke [facility]    TIA      History reviewed. No pertinent surgical history.    Social History:     Social History   Tobacco Use  . Smoking status: Current Every Day Smoker    Types: Cigarettes  . Smokeless tobacco: Never Used  Substance Use Topics  . Alcohol use: Yes    Comment: "sometimes"     Lives - at home  Mobility - walks by self   Family History :     Family History  Problem Relation Age of Onset  . Hypertension Mother   . Hypertension Father   . Cancer  Brother     Home Medications:   Prior to Admission medications   Medication Sig Start Date End Date Taking? Authorizing Provider  hydrALAZINE (APRESOLINE) 25 MG tablet Take 1 tablet (25 mg total) by mouth 3 (three) times daily. 05/09/16   Triplett, Tammy, PA-C     Allergies:    No Known Allergies   Physical Exam:   Vitals  Blood pressure (!) 212/144, pulse 61, temperature 98.4 F (36.9 C), temperature source Oral, resp. rate 15, height 5\' 10"  (1.778 m), weight 77.1 kg (170 lb), SpO2 100 %.   1. General lying in bed in NAD,   2. Normal affect and insight, Not Suicidal or Homicidal, Awake Alert, Oriented X 3., slight slurred speech.  Slight right facial droop  3. No F.N deficits, ALL C.Nerves Intact, Strength 5/5 all 4 extremities, Sensation intact all 4 extremities, Plantars down going.  4. Ears and Eyes appear Normal, Conjunctivae clear, PERRLA. Moist Oral Mucosa.  5. Supple Neck, No JVD, No cervical lymphadenopathy appriciated, No Carotid Bruits.  6. Symmetrical Chest wall movement, Good air movement bilaterally, CTAB.  7. RRR, No Gallops, Rubs or Murmurs, No Parasternal Heave.  8. Positive Bowel Sounds, Abdomen Soft, No  tenderness, No organomegaly appriciated,No rebound -guarding or rigidity.  9.  No Cyanosis, Normal Skin Turgor, No Skin Rash or Bruise.  10. Good muscle tone,  joints appear normal , no effusions, Normal ROM.  11. No Palpable Lymph Nodes in Neck or Axillae     Data Review:    CBC Recent Labs  Lab 08/02/17 1804  WBC 7.6  HGB 12.8*  HCT 40.5  PLT 145*  MCV 86.0  MCH 27.2  MCHC 31.6  RDW 14.3  LYMPHSABS 2.8  MONOABS 0.6  EOSABS 0.1  BASOSABS 0.0   ------------------------------------------------------------------------------------------------------------------  Chemistries  Recent Labs  Lab 08/02/17 1804  NA 138  K 3.5  CL 101  CO2 28  GLUCOSE 116*  BUN 19  CREATININE 2.18*  CALCIUM 9.4  AST 16  ALT 8*  ALKPHOS 61  BILITOT 0.6   ------------------------------------------------------------------------------------------------------------------ estimated creatinine clearance is 35.8 mL/min (A) (by C-G formula based on SCr of 2.18 mg/dL (H)). ------------------------------------------------------------------------------------------------------------------ No results for input(s): TSH, T4TOTAL, T3FREE, THYROIDAB in the last 72 hours.  Invalid input(s): FREET3  Coagulation profile No results for input(s): INR, PROTIME in the last 168 hours. ------------------------------------------------------------------------------------------------------------------- No results for input(s): DDIMER in the last 72 hours. -------------------------------------------------------------------------------------------------------------------  Cardiac Enzymes No results for input(s): CKMB, TROPONINI, MYOGLOBIN in the last 168 hours.  Invalid input(s): CK ------------------------------------------------------------------------------------------------------------------ No results found for:  BNP   ---------------------------------------------------------------------------------------------------------------  Urinalysis    Component Value Date/Time   COLORURINE YELLOW 11/20/2015 [place] 11/20/2015 1029   LABSPEC 1.018 11/20/2015 1029   PHURINE 6.0 11/20/2015 1029   GLUCOSEU NEGATIVE 11/20/2015 1029   HGBUR TRACE (A) 11/20/2015 1029   BILIRUBINUR NEGATIVE 11/20/2015 1029   KETONESUR NEGATIVE 11/20/2015 1029   PROTEINUR 100 (A) 11/20/2015 1029   UROBILINOGEN 0.2 06/06/2014 2056   NITRITE NEGATIVE 11/20/2015 1029   LEUKOCYTESUR NEGATIVE 11/20/2015 1029    ----------------------------------------------------------------------------------------------------------------   Imaging Results:    Ct Head Wo Contrast  Result Date: 08/02/2017 CLINICAL DATA:  Visual changes facial droop elevated BP EXAM: CT HEAD WITHOUT CONTRAST TECHNIQUE: Contiguous axial images were obtained from the base of the skull through the vertex without intravenous contrast. COMPARISON:  Brain MRI 11/10/2009, 11/08/2009 head CT FINDINGS: Brain: No acute territorial infarction, hemorrhage, or intracranial mass is seen. Moderate  hypodensity in the bilateral white matter consistent with small vessel ischemic change, progressed since prior study. Multiple old appearing lacunar infarcts in the bilateral basal ganglia and thalamus, many of which are new compared to the prior CT. Moderate atrophy. Prominent ventricle size likely due to progression of atrophy Vascular: No hyperdense vessels.  No unexpected calcification Skull: Normal. Negative for fracture or focal lesion. Sinuses/Orbits: Mild mucosal thickening in the ethmoid sinuses. No acute orbital abnormality Other: None IMPRESSION: 1. No definite CT evidence for acute intracranial abnormality 2. Moderate small vessel ischemic changes of the white matter, progression since prior study. Numerous old appearing lacunar infarcts in the basal ganglia and  thalamus, some of which are new compared to the prior CT 3. Atrophy Electronically Signed   By: Donavan Foil M.D.   On: 08/02/2017 19:19       Assessment & Plan:    Principal Problem:   Dizziness Active Problems:   Hypertensive urgency   Stroke ([place])   ARF (acute renal failure) (HCC)    Dizziness, ? Hypertensive emergency vs CVA MRI brain Carotid ultrasound, cardiac echo Aspirin 325mg  po qday Improve bp control  ARF Check urine sodium, urine creatinine , urine eosinophils Check renal ultrasound Hydrate gently with ns iv Check cmp in am  Hyperglycemia Check hga1c, if elevated > 6.5 then ISS   DVT Prophylaxis SCDs  AM Labs Ordered, also please review Full Orders  Family Communication: Admission, patients condition and plan of care including tests being ordered have been discussed with the patient  who indicate understanding and agree with the plan and Code Status.  Code Status FULL CODE  Likely DC to  home  Condition GUARDED    Consults called:  Neurology in am  Admission status: inpatient  Time spent in minutes : 45   Jani Gravel M.D on 08/02/2017 at 9:12 PM  Between 7am to 7pm - Pager - [phone]  . After 7pm go to [email or web address] - password [facility]  [facility] - Office  [phone]

## 2017-08-02 NOTE — ED Notes (Signed)
Dr. Roderic Palau aware of pt.

## 2017-08-03 ENCOUNTER — Inpatient Hospital Stay (HOSPITAL_COMMUNITY): Payer: Medicaid Other

## 2017-08-03 DIAGNOSIS — I639 Cerebral infarction, unspecified: Secondary | ICD-10-CM | POA: Diagnosis present

## 2017-08-03 DIAGNOSIS — I161 Hypertensive emergency: Secondary | ICD-10-CM

## 2017-08-03 LAB — LIPID PANEL
Cholesterol: 121 mg/dL (ref 0–200)
HDL: 34 mg/dL — ABNORMAL LOW (ref >40)
LDL CALC: 69 mg/dL (ref 0–99)
TRIGLYCERIDES: 89 mg/dL (ref <150)
Total CHOL/HDL Ratio: 3.6 RATIO
VLDL: 18 mg/dL (ref 0–40)

## 2017-08-03 LAB — URINALYSIS, COMPLETE (UACMP) WITH MICROSCOPIC
BACTERIA UA: NONE SEEN
Bilirubin Urine: NEGATIVE
Glucose, UA: NEGATIVE mg/dL
Hgb urine dipstick: NEGATIVE
KETONES UR: NEGATIVE mg/dL
Nitrite: NEGATIVE
PROTEIN: 30 mg/dL — AB
Specific Gravity, Urine: 1.013 (ref 1.005–1.030)
pH: 6 (ref 5.0–8.0)

## 2017-08-03 LAB — ECHOCARDIOGRAM COMPLETE
AVLVOTPG: 5 mmHg
E decel time: 292 msec
E/e' ratio: 8.21
FS: 30 % (ref 28–44)
Height: 70 in
IVS/LV PW RATIO, ED: 0.88
LA ID, A-P, ES: 26 mm
LA diam end sys: 26 mm
LA diam index: 1.33 cm/m2
LA vol index: 24.1 mL/m2
LAVOL: 47.2 mL
LAVOLA4C: 55.5 mL
LV E/e' medial: 8.21
LV E/e'average: 8.21
LV PW d: 15.2 mm — AB (ref 0.6–1.1)
LV dias vol: 93 mL (ref 62–150)
LV e' LATERAL: 6.64 cm/s
LVDIAVOLIN: 48 mL/m2
LVOT SV: 88 mL
LVOT VTI: 23.1 cm
LVOT area: 3.8 cm2
LVOT peak vel: 113 cm/s
LVOTD: 22 mm
LVSYSVOL: 37 mL (ref 21–61)
LVSYSVOLIN: 19 mL/m2
MV Dec: 292
MV pk A vel: 77.1 m/s
MV pk E vel: 54.5 m/s
RV LATERAL S' VELOCITY: 13.6 cm/s
RV TAPSE: 21.2 mm
Simpson's disk: 61
Stroke v: 57 ml
TDI e' lateral: 6.64
TDI e' medial: 4.46
Weight: 2720 oz

## 2017-08-03 LAB — BASIC METABOLIC PANEL
ANION GAP: 10 (ref 5–15)
BUN: 17 mg/dL (ref 6–20)
CO2: 24 mmol/L (ref 22–32)
Calcium: 8.5 mg/dL — ABNORMAL LOW (ref 8.9–10.3)
Chloride: 108 mmol/L (ref 101–111)
Creatinine, Ser: 1.7 mg/dL — ABNORMAL HIGH (ref 0.61–1.24)
GFR, EST AFRICAN AMERICAN: 48 mL/min — AB (ref >60)
GFR, EST NON AFRICAN AMERICAN: 41 mL/min — AB (ref >60)
Glucose, Bld: 101 mg/dL — ABNORMAL HIGH (ref 65–99)
Potassium: 3.5 mmol/L (ref 3.5–5.1)
SODIUM: 142 mmol/L (ref 135–145)

## 2017-08-03 LAB — HEMOGLOBIN A1C
Hgb A1c MFr Bld: 5.7 % — ABNORMAL HIGH (ref 4.8–5.6)
Mean Plasma Glucose: 116.89 mg/dL

## 2017-08-03 LAB — CBC
HCT: 36.1 % — ABNORMAL LOW (ref 39.0–52.0)
HEMOGLOBIN: 11.4 g/dL — AB (ref 13.0–17.0)
MCH: 27.1 pg (ref 26.0–34.0)
MCHC: 31.6 g/dL (ref 30.0–36.0)
MCV: 86 fL (ref 78.0–100.0)
PLATELETS: 124 10*3/uL — AB (ref 150–400)
RBC: 4.2 MIL/uL — AB (ref 4.22–5.81)
RDW: 14.5 % (ref 11.5–15.5)
WBC: 7.6 10*3/uL (ref 4.0–10.5)

## 2017-08-03 LAB — TROPONIN I: Troponin I: <0.03 ng/mL (ref <0.03)

## 2017-08-03 MED ORDER — CLOPIDOGREL BISULFATE 75 MG PO TABS
300.0000 mg | ORAL_TABLET | Freq: Once | ORAL | Status: AC
  Administered 2017-08-03: 300 mg via ORAL
  Filled 2017-08-03: qty 4

## 2017-08-03 MED ORDER — ACETAMINOPHEN 160 MG/5ML PO SOLN: 650.0000 mg | ORAL | Status: DC | PRN

## 2017-08-03 MED ORDER — ATORVASTATIN CALCIUM 40 MG PO TABS
80.0000 mg | ORAL_TABLET | Freq: Every day | ORAL | Status: DC
  Administered 2017-08-03 – 2017-08-07 (×5): 80 mg via ORAL
  Filled 2017-08-03 (×7): qty 2

## 2017-08-03 MED ORDER — HYDRALAZINE HCL 25 MG PO TABS
25.0000 mg | ORAL_TABLET | Freq: Three times a day (TID) | ORAL | Status: DC
  Administered 2017-08-03: 25 mg via ORAL
  Filled 2017-08-03: qty 1

## 2017-08-03 MED ORDER — PNEUMOCOCCAL VAC POLYVALENT 25 MCG/0.5ML IJ INJ
0.5000 mL | INJECTION | INTRAMUSCULAR | Status: AC
  Administered 2017-08-04: 0.5 mL via INTRAMUSCULAR
  Filled 2017-08-03: qty 0.5

## 2017-08-03 MED ORDER — CLONIDINE HCL 0.1 MG PO TABS
0.1000 mg | ORAL_TABLET | Freq: Four times a day (QID) | ORAL | Status: DC
  Administered 2017-08-03: 0.1 mg via ORAL
  Filled 2017-08-03: qty 1

## 2017-08-03 MED ORDER — HYDRALAZINE HCL 25 MG PO TABS
25.0000 mg | ORAL_TABLET | Freq: Three times a day (TID) | ORAL | Status: DC
  Administered 2017-08-03 (×2): 25 mg via ORAL
  Filled 2017-08-03 (×2): qty 1

## 2017-08-03 MED ORDER — ACETAMINOPHEN 325 MG PO TABS: 650.0000 mg | ORAL_TABLET | ORAL | Status: DC | PRN

## 2017-08-03 MED ORDER — INFLUENZA VAC SPLIT QUAD 0.5 ML IM SUSY
0.5000 mL | PREFILLED_SYRINGE | INTRAMUSCULAR | Status: AC
  Administered 2017-08-04: 0.5 mL via INTRAMUSCULAR
  Filled 2017-08-03: qty 0.5

## 2017-08-03 MED ORDER — HYDRALAZINE HCL 25 MG PO TABS: 50.0000 mg | ORAL_TABLET | Freq: Three times a day (TID) | ORAL | Status: DC

## 2017-08-03 MED ORDER — SODIUM CHLORIDE 0.9 % IV SOLN
INTRAVENOUS | Status: AC
  Administered 2017-08-03: 06:00:00 via INTRAVENOUS

## 2017-08-03 MED ORDER — ACETAMINOPHEN 650 MG RE SUPP: 650.0000 mg | RECTAL | Status: DC | PRN

## 2017-08-03 MED ORDER — ASPIRIN 325 MG PO TABS
325.0000 mg | ORAL_TABLET | Freq: Every day | ORAL | Status: DC
  Administered 2017-08-03 – 2017-08-08 (×6): 325 mg via ORAL
  Filled 2017-08-03 (×6): qty 1

## 2017-08-03 MED ORDER — STROKE: EARLY STAGES OF RECOVERY BOOK
Freq: Once | Status: AC
  Administered 2017-08-03: 12:00:00
  Filled 2017-08-03: qty 1

## 2017-08-03 MED ORDER — CLOPIDOGREL BISULFATE 75 MG PO TABS
75.0000 mg | ORAL_TABLET | Freq: Every day | ORAL | Status: DC
  Administered 2017-08-04 – 2017-08-08 (×5): 75 mg via ORAL
  Filled 2017-08-03 (×5): qty 1

## 2017-08-03 MED ORDER — HEPARIN SODIUM (PORCINE) 5000 UNIT/ML IJ SOLN
5000.0000 [IU] | Freq: Three times a day (TID) | INTRAMUSCULAR | Status: DC
  Administered 2017-08-03 – 2017-08-08 (×16): 5000 [IU] via SUBCUTANEOUS
  Filled 2017-08-03 (×16): qty 1

## 2017-08-03 MED ORDER — ASPIRIN 300 MG RE SUPP: 300.0000 mg | Freq: Every day | RECTAL | Status: DC

## 2017-08-03 MED ORDER — HYDRALAZINE HCL 25 MG PO TABS: 25.0000 mg | ORAL_TABLET | Freq: Once | ORAL | Status: DC

## 2017-08-03 NOTE — ED Notes (Signed)
Pt removed from heart monitor due to having MRA performed at this time.

## 2017-08-03 NOTE — ED Notes (Signed)
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 High Risk for Embolic Disease. Recommending Vascular Surgery Consent.  Date & Time Notied:  08/03/17 1020  Provider Notified: Dr. Grandville [place]  Orders Received/Actions taken: MD notified

## 2017-08-03 NOTE — ED Notes (Signed)
PT with patient

## 2017-08-03 NOTE — Evaluation (Signed)
Physical Therapy Evaluation Patient Details Name: Jeffrey Carney MRN: [id number] DOB: [birth year] Today's Date: 08/03/2017   History of Present Illness  Jeffrey Carney  is a 62 y.o. male, w hx of CVA, hypertension apparently c/o dizziness starting about 8am.  No vertigo.  Slight headache "frontal", slight blurred vision out of left eye.  Pt states that dizziness mostly subsided. Still has blurred vision. Pt denies any focal weakness, numbness, tingling, slurred speech.  Pt denies cp, palp, sob, lower ext edema. Pt states that he has not been taking his bp medication. Pt not a TPA candidate due to outside TPA window    Clinical Impression  Patient functioning near baseline for functional mobility and gait, no significant differences in strength bilateral sides, demonstrates slightly labored slower than normal cadence without loss of balance.  Patient will benefit from continued physical therapy in hospital to increases strength/endurance for safe gait and ADLs.    Follow Up Recommendations No PT follow up    Equipment Recommendations  None recommended by PT    Recommendations for Other Services       Precautions / Restrictions Precautions Precautions: None Restrictions Weight Bearing Restrictions: No      Mobility  Bed Mobility Overal bed mobility: Modified Independent             General bed mobility comments: slightly labored movement  Transfers Overall transfer level: Needs assistance Equipment used: None Transfers: Sit to/from Stand;Stand Pivot Transfers Sit to Stand: Supervision Stand pivot transfers: Supervision       General transfer comment: slightly labored  Ambulation/Gait Ambulation/Gait assistance: Supervision Ambulation Distance (Feet): 100 Feet Assistive device: None     Gait velocity interpretation: Below normal speed for age/gender General Gait Details: slightly unsteady without loss of balance  Stairs            Wheelchair Mobility     Modified Rankin (Stroke Patients Only)       Balance Overall balance assessment: Needs assistance Sitting-balance support: No upper extremity supported Sitting balance-Leahy Scale: Good     Standing balance support: No upper extremity supported Standing balance-Leahy Scale: Fair Standing balance comment: fair/good without assistive device                             Pertinent Vitals/Pain      Home Living Family/patient expects to be discharged to:: Private residence Living Arrangements: Non-relatives/Friends Available Help at Discharge: Friend(s) Type of Home: Mobile home Home Access: Stairs to enter Entrance Stairs-Rails: Right;Left;Can reach both Entrance Stairs-Number of Steps: 4 Home Layout: One level Home Equipment: None      Prior Function Level of Independence: Independent         Comments: community ambulator, drives     Journalist, newspaper        Extremity/Trunk Assessment   Upper Extremity Assessment Upper Extremity Assessment: Defer to OT evaluation    Lower Extremity Assessment Lower Extremity Assessment: Overall WFL for tasks assessed    Cervical / Trunk Assessment Cervical / Trunk Assessment: Normal  Communication   Communication: No difficulties  Cognition Arousal/Alertness: Awake/alert Behavior During Therapy: WFL for tasks assessed/performed Overall Cognitive Status: Within Functional Limits for tasks assessed                                        General Comments      Exercises  Assessment/Plan    PT Assessment Patient needs continued PT services  PT Problem List Decreased activity tolerance;Decreased balance;Decreased mobility       PT Treatment Interventions Gait training;Stair training;Functional mobility training;Therapeutic activities;Therapeutic exercise;Patient/family education    PT Goals (Current goals can be found in the Care Plan section)  Acute Rehab PT Goals Patient Stated  Goal: return home PT Goal Formulation: With patient Time For Goal Achievement: 08/05/17 Potential to Achieve Goals: Good    Frequency 7X/week   Barriers to discharge        Co-evaluation               AM-PAC PT "6 Clicks" Daily Activity  Outcome Measure Difficulty turning over in bed (including adjusting bedclothes, sheets and blankets)?: None   Difficulty sitting down on and standing up from a chair with arms (e.g., wheelchair, bedside commode, etc,.)?: None Help needed moving to and from a bed to chair (including a wheelchair)?: None Help needed walking in hospital room?: A Little Help needed climbing 3-5 steps with a railing? : A Little 6 Click Score: 18    End of Session   Activity Tolerance: Patient tolerated treatment well Patient left: in bed;with call bell/phone within reach Nurse Communication: Mobility status PT Visit Diagnosis: Unsteadiness on feet (R26.81);Other abnormalities of gait and mobility (R26.89);Muscle weakness (generalized) (M62.81)    Time: 6834-1962 PT Time Calculation (min) (ACUTE ONLY): 27 min   Charges:   PT Evaluation $PT Eval Moderate Complexity: 1 Mod PT Treatments $Therapeutic Activity: 23-37 mins   PT G Codes:        3:53 PM, August 26, 2017 Lonell Grandchild, MPT Physical Therapist with [facility] [phone] office [phone] mobile phone

## 2017-08-03 NOTE — Progress Notes (Signed)
*  PRELIMINARY RESULTS* Echocardiogram 2D Echocardiogram has been performed.  Jeffrey Carney 08/03/2017, 11:09 AM

## 2017-08-03 NOTE — ED Notes (Signed)
Patient incontinent of bladder.  Patient cleaned changed adult brief on.

## 2017-08-03 NOTE — Progress Notes (Signed)
PROGRESS NOTE    Jeffrey Carney  ZJI:[id number] DOB: 1956/05/13 DOA: 08/02/2017 PCP: [name], MD   Brief Narrative:  Patient is a 62 year old gentleman history of CVA, hypertension presented with dizziness which started on the morning of admission around 8 AM.  No vertiginous symptoms.  Patient also complained of a frontal headache with some blurry vision out of the left eye.  Patient denied any focal weakness numbness or slurred speech.  Patient also noted to be in hypertensive urgency and had been noncompliant with his blood pressure medication.  Head CT done was unremarkable.  MRI of the head done consistent with an acute 4-5 mm CVA in the medial right parietal lobe.  MRA did not suggest any large or medium vessel occlusion or correctable stenosis.  2D echo pending.  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  Patient started on aspirin and Plavix.  Neurology consulted.   Assessment & Plan:   Principal Problem:   Stroke [facility]) Active Problems:   Hypertensive emergency   Dizziness   Hypertensive urgency   ARF (acute renal failure) (HCC)   #1 acute 4-5 mm CVA right medial parietal lobe Per MRI head.  Likely etiology of.  Dizziness.  Patient with some clinical improvement.  Patient denies any asymmetric weakness or numbness.  Patient on admission had complaints of blurry vision.  Stroke workup underway.  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  2D echo pending.  PT/OT/ST.  Permissive hypertension with goal blood pressure of 180/90.  Patient noted to be in hypertensive emergency on admission with blood pressure as high as 240/173.  Patient placed on a nitroglycerin drip.  Blood pressure currently at 149/89.  Discontinue nitroglycerin drip.  Patient started on  hydralazine and clonidine.  We will discontinue clonidine.  Increase hydralazine to 50 mg p.o. 3 times daily.  Fasting lipid panel with a LDL of 69.  Will discuss with vascular surgery about abnormal carotid Dopplers noted.  Patient started on aspirin daily.  Will load with Plavix 300 mg p.o. x1 and then Plavix 75 mg daily.  Case discussed with neurology.  Neurology will formally consult on the patient.  2.  Hypertensive emergency Likely secondary to noncompliance.  On presentation patient was noted to have blood pressure as high as 240/173.  Patient was placed on a nitroglycerin drip.  Current blood pressure 149/89.  Workup including MRI was consistent with an acute CVA and as such we will allow permissive hypertension with goal blood pressure of 180/90.  Will discontinue nitroglycerin drip for now.  Discontinue clonidine.  We will increase patient's hydralazine to 50 mg p.o. 3 times daily.  Follow.  3.  Dizziness Likely secondary to problems #1 and 2.  4.  Acute renal failure Presentation patient noted to have a creatinine of 2.18.  Prior creatinines noted at approximately 1.4 likely baseline.  Likely secondary to a prerenal azotemia.  Patient placed on hydration.  Urinalysis with moderate leukocytes, nitrite negative, too numerous to count WBCs.  Check a urine culture.  Urine electrolytes pending.  Renal ultrasound pending.  Continue gentle hydration.  Follow.  5.  Hyperglycemia Hemoglobin A1c pending.  Follow.    DVT prophylaxis: Heparin Code Status: Full Family Communication: Updated patient.  No family at bedside. Disposition Plan: Likely home  once stroke workup has been completed and hypertensive emergency resolved hopefully in the next 2-3 days.     Consultants:   Neurology pending  Procedures:   CT head 08/02/2017  MRI/MRA head 08/03/2017  Carotid ultrasound bilateral 08/03/2017  2D echo 08/03/2017 pending  Chest x-ray 08/03/2017  Antimicrobials:    None   Subjective: Sleeping however easily arousable.  Patient states dizziness has improved since presentation.  Blurry vision in the left eye slowly improving.  Patient denies any asymmetric weakness or numbness.  Patient denies any slurred speech.  Tolerated diet and pass swallow evaluation.  Objective: Vitals:   08/03/17 1000 08/03/17 1008 08/03/17 1020 08/03/17 1030  BP: (!) 196/97 (!) 196/97 (!) 159/94 (!) 155/92  Pulse: (!) 59 68 (!) 51 (!) 49  Resp: 19 18    Temp:      TempSrc:      SpO2: 99% 99% 100% 100%  Weight:      Height:        Intake/Output Summary (Last 24 hours) at 08/03/2017 1103 Last data filed at 08/02/2017 2050 Gross per 24 hour  Intake 250 ml  Output -  Net 250 ml   Filed Weights   08/02/17 1737  Weight: 77.1 kg (170 lb)    Examination:  General exam: Appears calm and comfortable  Respiratory system: Clear to auscultation. Respiratory effort normal. Cardiovascular system: S1 & S2 heard, RRR. No JVD, murmurs, rubs, gallops or clicks. No pedal edema. Gastrointestinal system: Abdomen is nondistended, soft and nontender. No organomegaly or masses felt. Normal bowel sounds heard. Central nervous system: Alert and oriented. No focal neurological deficits. Extremities: Symmetric 5 x 5 power. Skin: No rashes, lesions or ulcers Psychiatry: Judgement and insight appear normal. Mood & affect appropriate.     Data Reviewed: I have personally reviewed following labs and imaging studies  CBC: Recent Labs  Lab 08/02/17 1804 08/03/17 1038  WBC 7.6 7.6  NEUTROABS 4.0  --   HGB 12.8* 11.4*  HCT 40.5 36.1*  MCV 86.0 86.0  PLT 145* 440*   Basic Metabolic Panel: Recent Labs  Lab 08/02/17 1804  NA 138  K 3.5  CL 101  CO2 28  GLUCOSE 116*  BUN 19  CREATININE 2.18*  CALCIUM 9.4   GFR: Estimated Creatinine Clearance: 35.8 mL/min (A) (by C-G formula based on SCr of 2.18 mg/dL (H)). Liver Function Tests: Recent Labs  Lab 08/02/17 1804  AST 16   ALT 8*  ALKPHOS 61  BILITOT 0.6  PROT 8.0  ALBUMIN 3.6   No results for input(s): LIPASE, AMYLASE in the last 168 hours. No results for input(s): AMMONIA in the last 168 hours. Coagulation Profile: No results for input(s): INR, PROTIME in the last 168 hours. Cardiac Enzymes: Recent Labs  Lab 08/03/17 0549  TROPONINI <0.03   BNP (last 3 results) No results for input(s): PROBNP in the last 8760 hours. HbA1C: No results for input(s): HGBA1C in the last 72 hours. CBG: No results for input(s): GLUCAP in the last 168 hours. Lipid Profile: Recent Labs    08/03/17 0549  CHOL 121  HDL 34*  LDLCALC 69  TRIG 89  CHOLHDL 3.6   Thyroid Function Tests: No results for input(s): TSH, T4TOTAL, FREET4, T3FREE, THYROIDAB in the last 72 hours. Anemia Panel: No results for input(s): VITAMINB12, FOLATE, FERRITIN, TIBC, IRON, RETICCTPCT in the last 72 hours. Sepsis Labs: No results for input(s): PROCALCITON, LATICACIDVEN in the last 168 hours.  No results found for this or any  previous visit (from the past 240 hour(s)).       Radiology Studies: Dg Chest 2 View  Result Date: 08/03/2017 CLINICAL DATA:  Dizziness.  History of hypertension. EXAM: CHEST  2 VIEW COMPARISON:  None. FINDINGS: Cardiomediastinal silhouette is normal. No pleural effusions or focal consolidations. Mild bronchitic changes. Trachea projects midline and there is no pneumothorax. Soft tissue planes and included osseous structures are non-suspicious. Mild degenerative change of the spine. IMPRESSION: Mild bronchitic changes without focal consolidation. Electronically Signed   By: Elon Alas M.D.   On: 08/03/2017 06:23   Ct Head Wo Contrast  Result Date: 08/02/2017 CLINICAL DATA:  Visual changes facial droop elevated BP EXAM: CT HEAD WITHOUT CONTRAST TECHNIQUE: Contiguous axial images were obtained from the base of the skull through the vertex without intravenous contrast. COMPARISON:  Brain MRI 11/10/2009,  11/08/2009 head CT FINDINGS: Brain: No acute territorial infarction, hemorrhage, or intracranial mass is seen. Moderate hypodensity in the bilateral white matter consistent with small vessel ischemic change, progressed since prior study. Multiple old appearing lacunar infarcts in the bilateral basal ganglia and thalamus, many of which are new compared to the prior CT. Moderate atrophy. Prominent ventricle size likely due to progression of atrophy Vascular: No hyperdense vessels.  No unexpected calcification Skull: Normal. Negative for fracture or focal lesion. Sinuses/Orbits: Mild mucosal thickening in the ethmoid sinuses. No acute orbital abnormality Other: None IMPRESSION: 1. No definite CT evidence for acute intracranial abnormality 2. Moderate small vessel ischemic changes of the white matter, progression since prior study. Numerous old appearing lacunar infarcts in the basal ganglia and thalamus, some of which are new compared to the prior CT 3. Atrophy Electronically Signed   By: Donavan Foil M.D.   On: 08/02/2017 19:19   Mr Brain Wo Contrast  Result Date: 08/03/2017 CLINICAL DATA:  Weakness, dizziness and blurred vision over the last day. EXAM: MRI HEAD WITHOUT CONTRAST MRA HEAD WITHOUT CONTRAST TECHNIQUE: Multiplanar, multiecho pulse sequences of the brain and surrounding structures were obtained without intravenous contrast. Angiographic images of the head were obtained using MRA technique without contrast. COMPARISON:  Head CT 08/02/2017.  MRI 11/10/2009. FINDINGS: MRI HEAD FINDINGS Brain: Diffusion imaging shows a 4-5 mm acute infarction within the medial right parietal lobe. No other acute infarction. There chronic small-vessel ischemic changes of the pons. No focal cerebellar insult. There are old small vessel infarctions affecting the thalami, basal ganglia and throughout the cerebral hemispheric deep and subcortical white matter. No large vessel territory infarction. No mass lesion, hemorrhage,  hydrocephalus or extra-axial collection. The findings are progressive since the study of 2011. Vascular: Major vessels at the base of the brain show flow. Skull and upper cervical spine: Negative Sinuses/Orbits: Clear/normal Other: None MRA HEAD FINDINGS Both internal carotid arteries are patent through the skull base and siphon regions. Some artifact in the supraclinoid region prevents accurate evaluation of that segment. The anterior and middle cerebral vessels are patent without proximal stenosis, aneurysm or vascular malformation. Both vertebral arteries are patent to the basilar. No basilar stenosis. Again there is some artifact in the midportion. Superior cerebellar and posterior cerebral arteries are patent. IMPRESSION: 4-5 mm acute infarction within the medial right parietal lobe. Advanced chronic small-vessel ischemic changes elsewhere throughout the brain, with particular involvement of the thalami and basal ganglia. Findings are progressive since 2011. Motion degraded intracranial MR angiography does not suggest any large or medium vessel occlusion or correctable stenosis. See above. Electronically Signed   By: Jan Fireman.D.  On: 08/03/2017 08:03   US Carotid Bilateral (at [facility] And Ap Only)  Result Date: 08/03/2017 CLINICAL DATA:  CVA and syncope. EXAM: BILATERAL CAROTID DUPLEX ULTRASOUND TECHNIQUE: Pearline Cables scale imaging, color Doppler and duplex ultrasound were performed of bilateral carotid and vertebral arteries in the neck. COMPARISON:  11/09/2009 FINDINGS: Criteria: Quantification of carotid stenosis is based on velocity parameters that correlate the residual internal carotid diameter with NASCET-based stenosis levels, using the diameter of the distal internal carotid lumen as the denominator for stenosis measurement. The following velocity measurements were obtained: RIGHT ICA:  62 cm/sec CCA:  60 cm/sec SYSTOLIC ICA/CCA RATIO:  1.1 DIASTOLIC ICA/CCA RATIO:  1.8 ECA:  116 cm/sec LEFT ICA:  69  cm/sec CCA:  52 cm/sec SYSTOLIC ICA/CCA RATIO:  1.3 DIASTOLIC ICA/CCA RATIO:  2.2 ECA:  99 cm/sec RIGHT CAROTID ARTERY: The right carotid bulb is markedly abnormal and enlarged. There is a large amount of heterogeneous material surrounding the lumen. Findings are concerning for dilatation of the carotid bulb and a large amount of mural thrombus. Carotid bulb roughly measures up to 2.3 cm and previously measured roughly 1.6 cm. External carotid artery is patent with normal waveform. Irregular plaque and mural thrombus in the proximal internal carotid artery. Normal waveforms and velocities in the internal carotid artery. RIGHT VERTEBRAL ARTERY: Antegrade flow and normal waveform in the right vertebral artery. LEFT CAROTID ARTERY: Again noted is mild ectasia of the left carotid bulb with peripheral plaque. Plaque in the left carotid bulb region is similar to the previous examination. Small amount of plaque at the origin of the external carotid artery. External carotid artery is patent with normal waveform. Small amount of plaque in the proximal internal carotid artery. Normal waveforms and velocities in the internal carotid artery. LEFT VERTEBRAL ARTERY: Antegrade flow and normal waveform in the left vertebral artery. 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 The right carotid bulb is enlarged measuring up to 2.3 cm. 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 This represents progression of disease from the previous examination. This large amount of mural thrombus and plaque appears to be high risk for embolic disease. Recommend vascular surgery consultation. Estimated degree of stenosis in the internal carotid arteries is less than 50%. Ectasia and plaque at the left carotid bulb is similar to the prior examination. Patent vertebral arteries with antegrade flow. These results will be called to the ordering clinician or  representative by the Radiologist Assistant, and communication documented in the PACS or zVision Dashboard. Electronically Signed   By: Markus Daft M.D.   On: 08/03/2017 09:52   Mr Jodene Nam Head/brain NI Cm  Result Date: 08/03/2017 CLINICAL DATA:  Weakness, dizziness and blurred vision over the last day. EXAM: MRI HEAD WITHOUT CONTRAST MRA HEAD WITHOUT CONTRAST TECHNIQUE: Multiplanar, multiecho pulse sequences of the brain and surrounding structures were obtained without intravenous contrast. Angiographic images of the head were obtained using MRA technique without contrast. COMPARISON:  Head CT 08/02/2017.  MRI 11/10/2009. FINDINGS: MRI HEAD FINDINGS Brain: Diffusion imaging shows a 4-5 mm acute infarction within the medial right parietal lobe. No other acute infarction. There chronic small-vessel ischemic changes of the pons. No focal cerebellar insult. There are old small vessel infarctions affecting the thalami, basal ganglia and throughout the cerebral hemispheric deep and subcortical white matter. No large vessel territory infarction. No mass lesion, hemorrhage, hydrocephalus or extra-axial collection. The findings are progressive since  the study of 2011. Vascular: Major vessels at the base of the brain show flow. Skull and upper cervical spine: Negative Sinuses/Orbits: Clear/normal Other: None MRA HEAD FINDINGS Both internal carotid arteries are patent through the skull base and siphon regions. Some artifact in the supraclinoid region prevents accurate evaluation of that segment. The anterior and middle cerebral vessels are patent without proximal stenosis, aneurysm or vascular malformation. Both vertebral arteries are patent to the basilar. No basilar stenosis. Again there is some artifact in the midportion. Superior cerebellar and posterior cerebral arteries are patent. IMPRESSION: 4-5 mm acute infarction within the medial right parietal lobe. Advanced chronic small-vessel ischemic changes elsewhere  throughout the brain, with particular involvement of the thalami and basal ganglia. Findings are progressive since 2011. Motion degraded intracranial MR angiography does not suggest any large or medium vessel occlusion or correctable stenosis. See above. Electronically Signed   By: Nelson Chimes M.D.   On: 08/03/2017 08:03        Scheduled Meds: .  stroke: mapping our early stages of recovery book   Does not apply Once  . aspirin  300 mg Rectal Daily   Or  . aspirin  325 mg Oral Daily  . atorvastatin  80 mg Oral q1800  . clopidogrel  300 mg Oral Once  . [START ON 08/04/2017] clopidogrel  75 mg Oral Daily  . hydrALAZINE  25 mg Oral Once  . hydrALAZINE  50 mg Oral TID   Continuous Infusions: . sodium chloride 100 mL/hr at 08/03/17 1037  . nitroGLYCERIN 45 mcg/min (08/03/17 1036)     LOS: 1 day    Time spent: 40 minutes    Irine Seal, MD [facility] Pager [phone]  If 7PM-7AM, please contact night-coverage [email or web address] Password [facility] 08/03/2017, 11:03 AM

## 2017-08-03 NOTE — ED Notes (Signed)
Patient BP 149/89, verbal order to stop Nitro drip at this time, recheck blood pressure in 30 minutes, hold hydralazine at this time.

## 2017-08-03 NOTE — ED Notes (Addendum)
Dr. Grandville Silos made aware. BP 147/92 at 1150. Verbal orders given to d/c 25mg  Apresoline

## 2017-08-03 NOTE — Consult Note (Signed)
Jeffrey A. Merlene Laughter, MD     [email or web address]          Jeffrey Carney is an 62 y.o. male.   ASSESSMENT/PLAN: 1. 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 A loading dose of Plavix 300 mg is recommended along with continuation of full dose aspirin. He should continue with dual antiplatelet agents afterwards. Urgent consultation with vascular surgery is recommended to assess if the patient needs revascularization procedure/thrombectomy. The patient's blood pressure should be gradually lowered and not abruptly reduced.  2. Multiple lacunar infarcts and severe white matter disease all likely due to poorly controlled hypertension.  3. Chronic renal failure likely hypertensive in nature.  4. Mild cognitive impairment likely due to vascular disease.    This is a 62 year old black male who presents with the acute onset of gait ataxia, balance problems and some dizziness. He denies dysarthria, dysphagia, focal numbness or weakness. He also had severely elevated hypertension with concern for hypertensive urgency. His blood pressure was treated. He does not report having headaches, chest pain or shortness of breath however. The review systems is otherwise negative.  GENERAL: Pleasant male in no acute distress.   HEENT: Poor dentition; neck is supple; no tremor appreciated  ABDOMEN: soft  EXTREMITIES: No edema   BACK: Normal  SKIN: Normal by inspection.    MENTAL STATUS: Alert and oriented - including month and the his age. Speech - subtle dysarthria although this appears to be baseline, language and cognition are generally intact. Judgment and insight normal.   CRANIAL NERVES: Pupils are equal, round and reactive to light and accomodation; extra ocular movements are full, there is no significant nystagmus; visual fields are full; upper and lower facial muscles are normal in  strength and symmetric, there is no flattening of the nasolabial folds; tongue is midline; uvula is midline; shoulder elevation is normal.  MOTOR: Normal tone, bulk and strength; no pronator drift. No drift of the upper lower extremities.  COORDINATION: Left finger to nose is normal, right finger to nose is normal, No rest tremor; no intention tremor; no postural tremor; no bradykinesia.  REFLEXES: Deep tendon reflexes are symmetrical and normal but brisk in the legs. Plantar reflexes are flexor bilaterally.   SENSATION: Normal to light touch, temperature, and pain. The patient does not extinguish to double simultaneous stimulation.   NIH stroke scale 1.   Blood pressure (!) 155/85, pulse (!) 56, temperature 98.7 F (37.1 C), temperature source Oral, resp. rate 16, height '5\' 10"'  (1.778 m), weight 154 lb 8.7 oz (70.1 kg), SpO2 100 %.  Past Medical History:  Diagnosis Date  . Hypertension   . Stroke [facility])    TIA    History reviewed. No pertinent surgical history.  Family History  Problem Relation Age of Onset  . Hypertension Mother   . Hypertension Father   . Cancer Brother     Social History:  reports that he has been smoking cigarettes.  he has never used smokeless tobacco. He reports that he drinks alcohol. He reports that he uses drugs. Drug: Marijuana.  Allergies: No Known Allergies  Medications: Prior to Admission medications   Medication Sig Start Date End Date Taking? Authorizing Provider  hydrALAZINE (APRESOLINE) 25 MG tablet Take 1 tablet (25 mg total) by mouth 3 (three) times daily. 05/09/16  Yes Triplett, Tammy, PA-C    Scheduled Meds: . aspirin  300 mg Rectal Daily   Or  .  aspirin  325 mg Oral Daily  . atorvastatin  80 mg Oral q1800  . [START ON 08/04/2017] clopidogrel  75 mg Oral Daily  . heparin injection (subcutaneous)  5,000 Units Subcutaneous Q8H  . hydrALAZINE  25 mg Oral TID  . [START ON 08/04/2017] Influenza vac split quadrivalent PF  0.5 mL  Intramuscular Tomorrow-1000  . [START ON 08/04/2017] pneumococcal 23 valent vaccine  0.5 mL Intramuscular Tomorrow-1000   Continuous Infusions: . nitroGLYCERIN Stopped (08/03/17 1116)   PRN Meds:.acetaminophen **OR** acetaminophen (TYLENOL) oral liquid 160 mg/5 mL **OR** acetaminophen, hydrALAZINE     Results for orders placed or performed during the hospital encounter of 08/02/17 (from the past 48 hour(s))  CBC with Differential/Platelet     Status: Abnormal   Collection Time: 08/02/17  6:04 PM  Result Value Ref Range   WBC 7.6 4.0 - 10.5 K/uL   RBC 4.71 4.22 - 5.81 MIL/uL   Hemoglobin 12.8 (L) 13.0 - 17.0 g/dL   HCT 40.5 39.0 - 52.0 %   MCV 86.0 78.0 - 100.0 fL   MCH 27.2 26.0 - 34.0 pg   MCHC 31.6 30.0 - 36.0 g/dL   RDW 14.3 11.5 - 15.5 %   Platelets 145 (L) 150 - 400 K/uL   Neutrophils Relative % 53 %   Neutro Abs 4.0 1.7 - 7.7 K/uL   Lymphocytes Relative 37 %   Lymphs Abs 2.8 0.7 - 4.0 K/uL   Monocytes Relative 8 %   Monocytes Absolute 0.6 0.1 - 1.0 K/uL   Eosinophils Relative 2 %   Eosinophils Absolute 0.1 0.0 - 0.7 K/uL   Basophils Relative 0 %   Basophils Absolute 0.0 0.0 - 0.1 K/uL    Comment: Performed at [facility], [address] [place], [place] [address]  Comprehensive metabolic panel     Status: Abnormal   Collection Time: 08/02/17  6:04 PM  Result Value Ref Range   Sodium 138 135 - 145 mmol/L   Potassium 3.5 3.5 - 5.1 mmol/L   Chloride 101 101 - 111 mmol/L   CO2 28 22 - 32 mmol/L   Glucose, Bld 116 (H) 65 - 99 mg/dL   BUN 19 6 - 20 mg/dL   Creatinine, Ser 2.18 (H) 0.61 - 1.24 mg/dL   Calcium 9.4 8.9 - 10.3 mg/dL   Total Protein 8.0 6.5 - 8.1 g/dL   Albumin 3.6 3.5 - 5.0 g/dL   AST 16 15 - 41 U/L   ALT 8 (L) 17 - 63 U/L   Alkaline Phosphatase 61 38 - 126 U/L   Total Bilirubin 0.6 0.3 - 1.2 mg/dL   GFR calc non Af Amer 30 (L) >60 mL/min   GFR calc Af Amer 35 (L) >60 mL/min    Comment: (NOTE) The eGFR has been calculated using the CKD EPI  equation. This calculation has not been validated in all clinical situations. eGFR's persistently <60 mL/min signify possible Chronic Kidney Disease.    Anion gap 9 5 - 15    Comment: Performed at [facility], [address] [place], [place] [address]  Hemoglobin A1c     Status: Abnormal   Collection Time: 08/03/17  5:49 AM  Result Value Ref Range   Hgb A1c MFr Bld 5.7 (H) 4.8 - 5.6 %    Comment: (NOTE) Pre diabetes:          5.7%-6.4% Diabetes:              >6.4% Glycemic control for   <  7.0% adults with diabetes    Mean Plasma Glucose 116.89 mg/dL    Comment: Performed at [facility] [address], [place], [place] [address]  Lipid panel     Status: Abnormal   Collection Time: 08/03/17  5:49 AM  Result Value Ref Range   Cholesterol 121 0 - 200 mg/dL   Triglycerides 89 <150 mg/dL   HDL 34 (L) >40 mg/dL   Total CHOL/HDL Ratio 3.6 RATIO   VLDL 18 0 - 40 mg/dL   LDL Cholesterol 69 0 - 99 mg/dL    Comment:        Total Cholesterol/HDL:CHD Risk Coronary Heart Disease Risk Table                     Men   Women  1/2 Average Risk   3.4   3.3  Average Risk       5.0   4.4  2 X Average Risk   9.6   7.1  3 X Average Risk  23.4   11.0        Use the calculated Patient Ratio above and the CHD Risk Table to determine the patient's CHD Risk.        ATP III CLASSIFICATION (LDL):  <100     mg/dL   Optimal  100-129  mg/dL   Near or Above                    Optimal  130-159  mg/dL   Borderline  160-189  mg/dL   High  >190     mg/dL   Very High Performed at [facility], [address], [place], [place] [address]   Troponin I     Status: None   Collection Time: 08/03/17  5:49 AM  Result Value Ref Range   Troponin I <0.03 <0.03 ng/mL    Comment: Performed at [facility], [address] [place], [place] [address]  Urinalysis, Complete w Microscopic     Status: Abnormal   Collection Time: 08/03/17  5:57 AM  Result Value Ref Range   Color, Urine YELLOW YELLOW   APPearance  HAZY (A) CLEAR   Specific Gravity, Urine 1.013 1.005 - 1.030   pH 6.0 5.0 - 8.0   Glucose, UA NEGATIVE NEGATIVE mg/dL   Hgb urine dipstick NEGATIVE NEGATIVE   Bilirubin Urine NEGATIVE NEGATIVE   Ketones, ur NEGATIVE NEGATIVE mg/dL   Protein, ur 30 (A) NEGATIVE mg/dL   Nitrite NEGATIVE NEGATIVE   Leukocytes, UA MODERATE (A) NEGATIVE   RBC / HPF 0-5 0 - 5 RBC/hpf   WBC, UA TOO NUMEROUS TO COUNT 0 - 5 WBC/hpf   Bacteria, UA NONE SEEN NONE SEEN   Squamous Epithelial / LPF 0-5 (A) NONE SEEN    Comment: Performed at [facility], [address] [place], [place] [address]  CBC     Status: Abnormal   Collection Time: 08/03/17 10:38 AM  Result Value Ref Range   WBC 7.6 4.0 - 10.5 K/uL   RBC 4.20 (L) 4.22 - 5.81 MIL/uL   Hemoglobin 11.4 (L) 13.0 - 17.0 g/dL   HCT 36.1 (L) 39.0 - 52.0 %   MCV 86.0 78.0 - 100.0 fL   MCH 27.1 26.0 - 34.0 pg   MCHC 31.6 30.0 - 36.0 g/dL   RDW 14.5 11.5 - 15.5 %   Platelets 124 (L) 150 - 400 K/uL    Comment: Performed at [facility], [place]  [address] [place], [place] [address]  Basic metabolic panel     Status: Abnormal   Collection Time: 08/03/17 10:38 AM  Result Value Ref Range   Sodium 142 135 - 145 mmol/L   Potassium 3.5 3.5 - 5.1 mmol/L   Chloride 108 101 - 111 mmol/L   CO2 24 22 - 32 mmol/L   Glucose, Bld 101 (H) 65 - 99 mg/dL   BUN 17 6 - 20 mg/dL   Creatinine, Ser 1.70 (H) 0.61 - 1.24 mg/dL   Calcium 8.5 (L) 8.9 - 10.3 mg/dL   GFR calc non Af Amer 41 (L) >60 mL/min   GFR calc Af Amer 48 (L) >60 mL/min    Comment: (NOTE) The eGFR has been calculated using the CKD EPI equation. This calculation has not been validated in all clinical situations. eGFR's persistently <60 mL/min signify possible Chronic Kidney Disease.    Anion gap 10 5 - 15    Comment: Performed at [facility], [address] [place],  [address]    Studies/Results:  TTE - Left ventricle: The cavity size was normal. Wall thickness was   increased in a pattern of  moderate LVH. Systolic function was   normal. The estimated ejection fraction was in the range of 60%   to 65%. Wall motion was normal; there were no regional wall   motion abnormalities. Doppler parameters are consistent with   abnormal left ventricular relaxation (grade 1 diastolic   dysfunction). - Mitral valve: There was trivial regurgitation. - Right atrium: The atrium was at the upper limits of normal in   size. Central venous pressure (est): 3 mm Hg. - Atrial septum: No defect or patent foramen ovale was identified. - Tricuspid valve: There was trivial regurgitation. - Pulmonary arteries: Systolic pressure could not be accurately   estimated. - Pericardium, extracardiac: There was no pericardial effusion.   CAROTID  RIGHT  ICA:  62 cm/sec  CCA:  60 cm/sec  SYSTOLIC ICA/CCA RATIO:  1.1  DIASTOLIC ICA/CCA RATIO:  1.8  ECA:  116 cm/sec  LEFT  ICA:  69 cm/sec  CCA:  52 cm/sec  SYSTOLIC ICA/CCA RATIO:  1.3  DIASTOLIC ICA/CCA RATIO:  2.2  ECA:  99 cm/sec  RIGHT CAROTID ARTERY: The right carotid bulb is markedly abnormal and enlarged. There is a large amount of heterogeneous material surrounding the lumen. Findings are concerning for dilatation of the carotid bulb and a large amount of mural thrombus. Carotid bulb roughly measures up to 2.3 cm and previously measured roughly 1.6 cm. External carotid artery is patent with normal waveform. Irregular plaque and mural thrombus in the proximal internal carotid artery. Normal waveforms and velocities in the internal carotid artery.  RIGHT VERTEBRAL ARTERY: Antegrade flow and normal waveform in the right vertebral artery.  LEFT CAROTID ARTERY: Again noted is mild ectasia of the left carotid bulb with peripheral plaque. Plaque in the left carotid bulb region is similar to the previous examination. Small amount of plaque at the origin of the external carotid artery. External carotid artery is patent with  normal waveform. Small amount of plaque in the proximal internal carotid artery. Normal waveforms and velocities in the internal carotid artery.  LEFT VERTEBRAL ARTERY: Antegrade flow and normal waveform in the left vertebral artery.  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 The right carotid bulb is enlarged measuring up to 2.3 cm. There appears to be a large amount of mural thrombus in the carotid bulb occupying greater than 50% of  the lumen. This represents progression of disease from the previous examination. This large amount of mural thrombus and plaque appears to be high risk for embolic disease. Recommend vascular surgery consultation.  Estimated degree of stenosis in the internal carotid arteries is less than 50%.        BRAIN MRI MRA FINDINGS: MRI HEAD FINDINGS  Brain: Diffusion imaging shows a 4-5 mm acute infarction within the medial right parietal lobe. No other acute infarction. There chronic small-vessel ischemic changes of the pons. No focal cerebellar insult. There are old small vessel infarctions affecting the thalami, basal ganglia and throughout the cerebral hemispheric deep and subcortical white matter. No large vessel territory infarction. No mass lesion, hemorrhage, hydrocephalus or extra-axial collection. The findings are progressive since the study of 2011.  Vascular: Major vessels at the base of the brain show flow.  Skull and upper cervical spine: Negative  Sinuses/Orbits: Clear/normal  Other: None  MRA HEAD FINDINGS  Both internal carotid arteries are patent through the skull base and siphon regions. Some artifact in the supraclinoid region prevents accurate evaluation of that segment. The anterior and middle cerebral vessels are patent without proximal stenosis, aneurysm or vascular malformation.  Both vertebral arteries are patent to the basilar. No basilar stenosis. Again there is some artifact  in the midportion. Superior cerebellar and posterior cerebral arteries are patent.  IMPRESSION: 4-5 mm acute infarction within the medial right parietal lobe. Advanced chronic small-vessel ischemic changes elsewhere throughout the brain, with particular involvement of the thalami and basal ganglia. Findings are progressive since 2011.  Motion degraded intracranial MR angiography does not suggest any large or medium vessel occlusion or correctable stenosis. See above.           THE BRAIN MRI AND MRA REVIEWED IN PERSON.  There is small increased signal involving the medial parietal lobe on the right side seen on DWI.  This is consistent with small deep white matter infarct.  They are older areas of reduced signal seen on T1 on FLAIR involving the basal ganglia and thalamic regions.  There are multiple of these reasons.  There is even some suggestion of reduced signal involving the pontine region on the left side.  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  No hemorrhages appreciated.  No large vessel occlusive disease is appreciated.      Lakenzie Mcclafferty A. Merlene Carney, M.D.  Diplomate, Tax adviser of Psychiatry and Neurology ( Neurology). 08/03/2017, 7:16 PM

## 2017-08-03 NOTE — Plan of Care (Signed)
  Acute Rehab PT Goals(only PT should resolve) Pt Will Go Supine/Side To Sit 08/03/2017 1555 - Progressing by Lonell Grandchild, PT Flowsheets Taken 08/03/2017 1555  Pt will go Supine/Side to Sit Independently Patient Will Transfer Sit To/From Stand 08/03/2017 1555 - Progressing by Lonell Grandchild, PT Flowsheets Taken 08/03/2017 1555  Patient will transfer sit to/from stand Independently Pt Will Transfer Bed To Chair/Chair To Bed 08/03/2017 1555 - Progressing by Lonell Grandchild, PT Flowsheets Taken 08/03/2017 1555  Pt will Transfer Bed to Chair/Chair to Bed Independently Pt Will Ambulate 08/03/2017 1555 - Progressing by Lonell Grandchild, PT Flowsheets Taken 08/03/2017 1555  Pt will Ambulate Independently Pt Will Go Up/Down Stairs 08/03/2017 1555 - Progressing by Lonell Grandchild, PT Flowsheets Taken 08/03/2017 1555  Pt will Go Up / Down Stairs with modified independence;with rail(s);6-9 stairs  3:56 PM, 08/03/17 Lonell Grandchild, MPT Physical Therapist with [facility] [phone] office [phone] mobile phone

## 2017-08-04 ENCOUNTER — Inpatient Hospital Stay (HOSPITAL_COMMUNITY): Payer: Medicaid Other

## 2017-08-04 DIAGNOSIS — N390 Urinary tract infection, site not specified: Secondary | ICD-10-CM

## 2017-08-04 LAB — HIV ANTIBODY (ROUTINE TESTING W REFLEX): HIV SCREEN 4TH GENERATION: NONREACTIVE

## 2017-08-04 LAB — CBC
HEMATOCRIT: 38.8 % — AB (ref 39.0–52.0)
Hemoglobin: 12.3 g/dL — ABNORMAL LOW (ref 13.0–17.0)
MCH: 27.2 pg (ref 26.0–34.0)
MCHC: 31.7 g/dL (ref 30.0–36.0)
MCV: 85.7 fL (ref 78.0–100.0)
Platelets: 141 10*3/uL — ABNORMAL LOW (ref 150–400)
RBC: 4.53 MIL/uL (ref 4.22–5.81)
RDW: 14.6 % (ref 11.5–15.5)
WBC: 8 10*3/uL (ref 4.0–10.5)

## 2017-08-04 LAB — BASIC METABOLIC PANEL
Anion gap: 12 (ref 5–15)
BUN: 18 mg/dL (ref 6–20)
CALCIUM: 8.9 mg/dL (ref 8.9–10.3)
CO2: 23 mmol/L (ref 22–32)
CREATININE: 1.85 mg/dL — AB (ref 0.61–1.24)
Chloride: 102 mmol/L (ref 101–111)
GFR calc Af Amer: 43 mL/min — ABNORMAL LOW (ref >60)
GFR calc non Af Amer: 37 mL/min — ABNORMAL LOW (ref >60)
GLUCOSE: 100 mg/dL — AB (ref 65–99)
Potassium: 3.6 mmol/L (ref 3.5–5.1)
Sodium: 137 mmol/L (ref 135–145)

## 2017-08-04 LAB — CREATININE, URINE, RANDOM: Creatinine, Urine: 56.02 mg/dL

## 2017-08-04 LAB — SODIUM, URINE, RANDOM: Sodium, Ur: 126 mmol/L

## 2017-08-04 MED ORDER — ISOSORBIDE MONONITRATE ER 60 MG PO TB24
30.0000 mg | ORAL_TABLET | Freq: Every day | ORAL | Status: DC
  Administered 2017-08-04 – 2017-08-06 (×3): 30 mg via ORAL
  Filled 2017-08-04 (×3): qty 1

## 2017-08-04 MED ORDER — ONDANSETRON HCL 4 MG/2ML IJ SOLN
4.0000 mg | Freq: Four times a day (QID) | INTRAMUSCULAR | Status: DC | PRN
  Administered 2017-08-04: 4 mg via INTRAVENOUS
  Filled 2017-08-04: qty 2

## 2017-08-04 MED ORDER — HYDRALAZINE HCL 20 MG/ML IJ SOLN
10.0000 mg | Freq: Once | INTRAMUSCULAR | Status: AC
  Administered 2017-08-04: 10 mg via INTRAVENOUS

## 2017-08-04 MED ORDER — AMLODIPINE BESYLATE 5 MG PO TABS
10.0000 mg | ORAL_TABLET | Freq: Every day | ORAL | Status: DC
  Administered 2017-08-05 – 2017-08-08 (×4): 10 mg via ORAL
  Filled 2017-08-04 (×4): qty 2

## 2017-08-04 MED ORDER — ALUM & MAG HYDROXIDE-SIMETH 200-200-20 MG/5ML PO SUSP
30.0000 mL | Freq: Four times a day (QID) | ORAL | Status: DC | PRN
  Administered 2017-08-04: 30 mL via ORAL
  Filled 2017-08-04: qty 30

## 2017-08-04 MED ORDER — LABETALOL HCL 5 MG/ML IV SOLN
10.0000 mg | INTRAVENOUS | Status: DC | PRN
  Administered 2017-08-05 – 2017-08-07 (×3): 10 mg via INTRAVENOUS
  Filled 2017-08-04 (×3): qty 4

## 2017-08-04 MED ORDER — CEFTRIAXONE SODIUM 1 G IJ SOLR
1.0000 g | INTRAMUSCULAR | Status: DC
  Administered 2017-08-04 – 2017-08-05 (×2): 1 g via INTRAVENOUS
  Filled 2017-08-04: qty 10
  Filled 2017-08-04: qty 1
  Filled 2017-08-04: qty 10

## 2017-08-04 MED ORDER — AMLODIPINE BESYLATE 5 MG PO TABS
5.0000 mg | ORAL_TABLET | Freq: Every day | ORAL | Status: DC
  Administered 2017-08-04: 5 mg via ORAL
  Filled 2017-08-04: qty 1

## 2017-08-04 MED ORDER — AMLODIPINE BESYLATE 5 MG PO TABS
5.0000 mg | ORAL_TABLET | Freq: Once | ORAL | Status: AC
  Administered 2017-08-04: 5 mg via ORAL
  Filled 2017-08-04: qty 1

## 2017-08-04 MED ORDER — HYDRALAZINE HCL 20 MG/ML IJ SOLN
10.0000 mg | Freq: Four times a day (QID) | INTRAMUSCULAR | Status: DC | PRN
  Administered 2017-08-04: 10 mg via INTRAVENOUS
  Filled 2017-08-04: qty 1

## 2017-08-04 MED ORDER — SODIUM CHLORIDE 0.9 % IV SOLN
INTRAVENOUS | Status: DC
  Administered 2017-08-04 – 2017-08-05 (×2): via INTRAVENOUS

## 2017-08-04 MED ORDER — HYDRALAZINE HCL 25 MG PO TABS
75.0000 mg | ORAL_TABLET | Freq: Three times a day (TID) | ORAL | Status: DC
  Administered 2017-08-04 (×3): 75 mg via ORAL
  Filled 2017-08-04 (×4): qty 3

## 2017-08-04 MED ORDER — HYDRALAZINE HCL 25 MG PO TABS: 50.0000 mg | ORAL_TABLET | Freq: Three times a day (TID) | ORAL | Status: DC

## 2017-08-04 NOTE — Care Management Note (Signed)
Case Management Note  Patient Details  Name: Jeffrey Carney MRN: [id number] Date of Birth: [birth year]  Subjective/Objective:  Adm with stroke. From home, ind with ADL's. Has PCP and insurance (Medicaid). Discussed obtaining medications, he reports he has no issues obtaining or affording. Gets prescriptions filled out [facility]. Has no real reason for not taking BP meds.  Seen by PT/OT- no recommendations, patient back to baseline.  Patient declines need for [facility] RN.                Action/Plan: DC home with self care.    Expected Discharge Date:   08/04/2016               Expected Discharge Plan:  [place]  In-House Referral:     Discharge planning Services  CM Consult, Medication Assistance  Post Acute Care Choice:    Choice offered to:  Patient  DME Arranged:    DME Agency:     HH Arranged:    [place] Agency:     Status of Service:  Completed, signed off  If discussed at [employer], dates discussed:    Additional Comments:  Velia Pamer, Chauncey [place], RN 08/04/2017, 11:59 AM

## 2017-08-04 NOTE — Progress Notes (Signed)
Physical Therapy Treatment Patient Details Name: Jeffrey Carney MRN: [id number] DOB: October 05, 1955 Today's Date: 08/04/2017    History of Present Illness Jeffrey Carney  is a 62 y.o. male, w hx of CVA, hypertension apparently c/o dizziness starting about 8am.  No vertigo.  Slight headache "frontal", slight blurred vision out of left eye.  Pt states that dizziness mostly subsided. Still has blurred vision. Pt denies any focal weakness, numbness, tingling, slurred speech.  Pt denies cp, palp, sob, lower ext edema. Pt states that he has not been taking his bp medication. Pt not a TPA candidate due to outside TPA window    PT Comments    Patient functioning at baseline for functional mobility and gait and states he normally uses depends at home.  Plan:  Discharge patient from physical therapy to care of nursing for ambulation daily as tolerated for length of stay.   Follow Up Recommendations  No PT follow up     Equipment Recommendations  None recommended by PT    Recommendations for Other Services       Precautions / Restrictions Precautions Precautions: None Restrictions Weight Bearing Restrictions: No    Mobility  Bed Mobility Overal bed mobility: Modified Independent                Transfers Overall transfer level: Independent Equipment used: None                Ambulation/Gait Ambulation/Gait assistance: Modified independent (Device/Increase time) Ambulation Distance (Feet): 150 Feet Assistive device: None Gait Pattern/deviations: WFL(Within Functional Limits)   Gait velocity interpretation: Below normal speed for age/gender General Gait Details: expect slightly slower than normal speed of cadence which may be baseline for patient   Stairs Stairs: Yes   Stair Management: One rail Right;One rail Left;Alternating pattern Number of Stairs: 9 General stair comments: patient demonstrates good return for going up/down stairs using 1 siderail without loss of  balance  Wheelchair Mobility    Modified Rankin (Stroke Patients Only)       Balance Overall balance assessment: No apparent balance deficits (not formally assessed)                                          Cognition Arousal/Alertness: Awake/alert Behavior During Therapy: WFL for tasks assessed/performed Overall Cognitive Status: Within Functional Limits for tasks assessed                                        Exercises      General Comments        Pertinent Vitals/Pain Pain Assessment: No/denies pain    Home Living                      Prior Function            PT Goals (current goals can now be found in the care plan section) Acute Rehab PT Goals Patient Stated Goal: return home PT Goal Formulation: With patient Time For Goal Achievement: 08/04/17 Potential to Achieve Goals: Good Progress towards PT goals: Goals met/education completed, patient discharged from PT    Frequency           PT Plan Other (comment)(Will discharge to nursing staff for ambulation daily for length of stay)    Co-evaluation  AM-PAC PT "6 Clicks" Daily Activity  Outcome Measure  Difficulty turning over in bed (including adjusting bedclothes, sheets and blankets)?: None Difficulty moving from lying on back to sitting on the side of the bed? : None Difficulty sitting down on and standing up from a chair with arms (e.g., wheelchair, bedside commode, etc,.)?: None Help needed moving to and from a bed to chair (including a wheelchair)?: None Help needed walking in hospital room?: None Help needed climbing 3-5 steps with a railing? : None 6 Click Score: 24    End of Session   Activity Tolerance: Patient tolerated treatment well Patient left: in bed;with call bell/phone within reach Nurse Communication: Mobility status PT Visit Diagnosis: Unsteadiness on feet (R26.81);Other abnormalities of gait and mobility  (R26.89);Muscle weakness (generalized) (M62.81)     Time: 3735-7897 PT Time Calculation (min) (ACUTE ONLY): 18 min  Charges:  $Gait Training: 8-22 mins                    G Codes:       1:56 PM, 08-07-17 Lonell Grandchild, MPT Physical Therapist with [facility] [phone] office [phone] mobile phone

## 2017-08-04 NOTE — Progress Notes (Signed)
Pt's significant other used call bell and stated he needed to be dried but she refused to allow staff to clean him up because we do not have depends.  I asked her if we could get him cleaned up and try a condom catheter and she declined the care stating "I think I have one of my own depends and I will take care of it". I informed her to let staff know if we could be of further assistance.  Nursing staff to continue to monitor.

## 2017-08-04 NOTE — Progress Notes (Signed)
Pt's BP is 204/127 and he is asymptomatic.  Dr. Myna Hidalgo notified via text page.  New order placed in to [facility] [place] and carried out.  Nursing staff to continue to monitor.

## 2017-08-04 NOTE — Progress Notes (Signed)
SLP Cancellation Note  Patient Details Name: Jeffrey Carney MRN: [id number] DOB: [birth year]   Cancelled treatment:       Reason Eval/Treat Not Completed: SLP screened, no needs identified, will sign off; Per PT/OT and staff, Pt functioning at baseline. Will defer SLE at this time. Reconsult if indicated.  Thank you,  Genene Churn, [place]    [place] 08/04/2017, 12:43 PM

## 2017-08-04 NOTE — Progress Notes (Signed)
OT Cancellation Note  Patient Details Name: Latif Nazareno MRN: [id number] DOB: Nov 13, 1955   Cancelled Treatment:    Reason Eval/Treat Not Completed: OT screened, no needs identified, will sign off. Pt screened for OT needs, BUE strength is WNL, coordination and sensation is intact. Vision assessment completed, visual fields are WNL no deficits noted. Pt reporting blurry vision has resolved, able to read multiple signs in room. No further OT services required at this time.   Guadelupe Sabin, OTR/L  [phone] 08/04/2017, 8:55 AM

## 2017-08-04 NOTE — Progress Notes (Signed)
PROGRESS NOTE    Jeffrey Carney  JJH:[id number] DOB: 24-Sep-1955 DOA: 08/02/2017 PCP: [name], MD   Brief Narrative:  Patient is a 62 year old gentleman history of CVA, hypertension presented with dizziness which started on the morning of admission around 8 AM.  No vertiginous symptoms.  Patient also complained of a frontal headache with some blurry vision out of the left eye.  Patient denied any focal weakness numbness or slurred speech.  Patient also noted to be in hypertensive urgency and had been noncompliant with his blood pressure medication.  Head CT done was unremarkable.  MRI of the head done consistent with an acute 4-5 mm CVA in the medial right parietal lobe.  MRA did not suggest any large or medium vessel occlusion or correctable stenosis.  2D echo pending.  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  Patient started on aspirin and Plavix.  Neurology consulted.   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  Likely etiology of dizziness.  Patient with some clinical improvement.  Patient denies any asymmetric weakness or numbness.  Patient on admission had complaints of blurry vision.  Stroke workup underway.  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  Spoke with vascular surgery concerning carotid Doppler findings and he was recommended to continue dual antiplatelet treatment as recommended by neurology with outpatient follow-up in about 3-4  weeks with vascular surgery, Dr. Bridgett Larsson.  2D echo with EF of 60-65%, no wall motion abnormalities, grade 1 diastolic dysfunction, no embolic source noted.  Permissive hypertension with goal blood pressure of 180/90.  Patient noted to be in hypertensive emergency on admission with blood pressure as high as 240/173.  Patient placed on a nitroglycerin drip.  Blood pressure improved on nitroglycerin drip as well as hydralazine 25 mg 3 times daily and Norvasc 5 mg daily.  Nitroglycerin drip discontinued.  Systolic blood pressure this morning in the 200s.  Increase hydralazine to 75 mg 3 times daily.  Increase Norvasc to 10 mg daily and follow.  May consider addition of Imdur if further blood pressure control is needed.  Fasting lipid panel with a LDL of 69.  This has been discussed and patient has been seen in consultation by neurology who are recommending dual antiplatelet therapy with Plavix and aspirin.  PT/OT.  Neurology following and I appreciate the input and recommendations.  2.  Hypertensive emergency Likely secondary to noncompliance.  On presentation patient was noted to have blood pressure as high as 240/173 on admission.  Patient was placed on a nitroglycerin drip.  Blood pressure improved on nitroglycerin drip and patient started on hydralazine 25 mg 3 times daily.  Systolic blood pressure this morning in the 200s.  Will increase Norvasc from 5-10 mg daily.  Increase hydralazine to 75 mg 3 times daily and monitor.  Permissive hypertension secondary to problem #1.  May consider addition of imdur if further blood pressure control needed.   3.  Dizziness  Likely secondary to problems #1 and 2.  Improved.  4.  Acute renal failure  Presentation patient noted to have a creatinine of 2.18.  Prior creatinines noted at approximately 1.4 likely baseline.  Likely secondary to a prerenal azotemia.  Patient placed on hydration.  Urinalysis with moderate leukocytes, nitrite negative, too numerous to count WBCs.   Urine cultures with greater than 100,000 gram-negative rods.  Urine sodium 126.  Urine creatinine 56.02.  Renal ultrasound with somewhat echogenic kidneys concerning for medical renal disease.  No obstruction focus in either kidney.  Prominent prostate.  Creatinine slowly trending down.  Continue gentle hydration.    5.  Hyperglycemia Hemoglobin A1c 5.7.  Follow.    6 UTI Urinalysis was abnormal on admission.  Urine cultures with greater than 100,000 gram-negative rods.  Will place empirically on IV Rocephin while sensitivities are pending.    DVT prophylaxis: Heparin Code Status: Full Family Communication: Updated patient.  No family at bedside. Disposition Plan: Likely home once stroke workup has been completed and hypertensive emergency resolved hopefully in the next 1-2 days.     Consultants:   Neurology: Dr. Merlene Laughter 08/03/2017  Procedures:   CT head 08/02/2017  MRI/MRA head 08/03/2017  Carotid ultrasound bilateral 08/03/2017  2D echo 08/03/2017  Chest x-ray 08/03/2017  Renal ultrasound 08/04/2017  Antimicrobials:   IV Rocephin 08/04/2017   Subjective: Patient awake and alert.  Denies any chest pain or shortness of breath.  Dizziness improved.  Patient noted to have systolic blood pressures in the 200s this morning.  Tolerating current diet.  No complaints.   Objective: Vitals:   08/04/17 0600 08/04/17 0938 08/04/17 1000 08/04/17 1241  BP: (!) 204/127 (!) 220/120 (!) 220/120 (!) 202/116  Pulse: 76  67   Resp: 16  18   Temp: 97.7 F (36.5 C)  97.9 F (36.6 C)   TempSrc: Oral  Oral   SpO2: 100%  100%   Weight:      Height:        Intake/Output Summary (Last 24 hours) at 08/04/2017 1530 Last data filed at 08/04/2017 6606 Gross per 24 hour  Intake 530.53 ml  Output -  Net 530.53 ml   Filed Weights   08/02/17 1737 08/03/17 1800  Weight: 77.1 kg (170 lb) 70.1 kg (154 lb 8.7 oz)    Examination:  General exam: Appears calm and comfortable  Respiratory  system: Clear to auscultation bilaterally.  No wheezes, no crackles, no rhonchi. Respiratory effort normal. Cardiovascular system: Regular rate and rhythm no murmurs rubs or gallops.  No JVD.  No lower extremity edema. Gastrointestinal system: Abdomen is soft, nontender, nondistended, positive bowel sounds. Central nervous system: Alert and oriented. No focal neurological deficits. Extremities: Symmetric 5 x 5 power. Skin: No rashes, lesions or ulcers Psychiatry: Judgement and insight appear normal. Mood & affect appropriate.     Data Reviewed: I have personally reviewed following labs and imaging studies  CBC: Recent Labs  Lab 08/02/17 1804 08/03/17 1038 08/04/17 0501  WBC 7.6 7.6 8.0  NEUTROABS 4.0  --   --   HGB 12.8* 11.4* 12.3*  HCT 40.5 36.1* 38.8*  MCV 86.0 86.0 85.7  PLT 145* 124* 301*   Basic Metabolic Panel: Recent Labs  Lab 08/02/17 1804 08/03/17 1038 08/04/17 0501  NA 138 142 137  K 3.5 3.5 3.6  CL 101 108 102  CO2 28 24 23   GLUCOSE 116* 101* 100*  BUN 19 17 18   CREATININE 2.18* 1.70* 1.85*  CALCIUM  9.4 8.5* 8.9   GFR: Estimated Creatinine Clearance: 40.5 mL/min (A) (by C-G formula based on SCr of 1.85 mg/dL (H)). Liver Function Tests: Recent Labs  Lab 08/02/17 1804  AST 16  ALT 8*  ALKPHOS 61  BILITOT 0.6  PROT 8.0  ALBUMIN 3.6   No results for input(s): LIPASE, AMYLASE in the last 168 hours. No results for input(s): AMMONIA in the last 168 hours. Coagulation Profile: No results for input(s): INR, PROTIME in the last 168 hours. Cardiac Enzymes: Recent Labs  Lab 08/03/17 0549  TROPONINI <0.03   BNP (last 3 results) No results for input(s): PROBNP in the last 8760 hours. HbA1C: Recent Labs    08/03/17 0549  HGBA1C 5.7*   CBG: No results for input(s): GLUCAP in the last 168 hours. Lipid Profile: Recent Labs    08/03/17 0549  CHOL 121  HDL 34*  LDLCALC 69  TRIG 89  CHOLHDL 3.6   Thyroid Function Tests: No results for  input(s): TSH, T4TOTAL, FREET4, T3FREE, THYROIDAB in the last 72 hours. Anemia Panel: No results for input(s): VITAMINB12, FOLATE, FERRITIN, TIBC, IRON, RETICCTPCT in the last 72 hours. Sepsis Labs: No results for input(s): PROCALCITON, LATICACIDVEN in the last 168 hours.  Recent Results (from the past 240 hour(s))  Culture, Urine     Status: Abnormal (Preliminary result)   Collection Time: 08/03/17  5:57 AM  Result Value Ref Range Status   Specimen Description   Final    URINE, CLEAN CATCH Performed at [facility], [address], [place],  [address]    Special Requests   Final    NONE Performed at [facility], [address], [place],  [address]    Culture >=100,000 COLONIES/mL GRAM NEGATIVE RODS (A)  Final   Report Status PENDING  Incomplete         Radiology Studies: Dg Chest 2 View  Result Date: 08/03/2017 CLINICAL DATA:  Dizziness.  History of hypertension. EXAM: CHEST  2 VIEW COMPARISON:  None. FINDINGS: Cardiomediastinal silhouette is normal. No pleural effusions or focal consolidations. Mild bronchitic changes. Trachea projects midline and there is no pneumothorax. Soft tissue planes and included osseous structures are non-suspicious. Mild degenerative change of the spine. IMPRESSION: Mild bronchitic changes without focal consolidation. Electronically Signed   By: Elon Alas M.D.   On: 08/03/2017 06:23   Ct Head Wo Contrast  Result Date: 08/02/2017 CLINICAL DATA:  Visual changes facial droop elevated BP EXAM: CT HEAD WITHOUT CONTRAST TECHNIQUE: Contiguous axial images were obtained from the base of the skull through the vertex without intravenous contrast. COMPARISON:  Brain MRI 11/10/2009, 11/08/2009 head CT FINDINGS: Brain: No acute territorial infarction, hemorrhage, or intracranial mass is seen. Moderate hypodensity in the bilateral white matter consistent with small vessel ischemic change, progressed since prior study. Multiple old appearing lacunar  infarcts in the bilateral basal ganglia and thalamus, many of which are new compared to the prior CT. Moderate atrophy. Prominent ventricle size likely due to progression of atrophy Vascular: No hyperdense vessels.  No unexpected calcification Skull: Normal. Negative for fracture or focal lesion. Sinuses/Orbits: Mild mucosal thickening in the ethmoid sinuses. No acute orbital abnormality Other: None IMPRESSION: 1. No definite CT evidence for acute intracranial abnormality 2. Moderate small vessel ischemic changes of the white matter, progression since prior study. Numerous old appearing lacunar infarcts in the basal ganglia and thalamus, some of which are new compared to the prior CT 3. Atrophy Electronically Signed   By: Madie Reno.D.  On: 08/02/2017 19:19   Mr Brain Wo Contrast  Result Date: 08/03/2017 CLINICAL DATA:  Weakness, dizziness and blurred vision over the last day. EXAM: MRI HEAD WITHOUT CONTRAST MRA HEAD WITHOUT CONTRAST TECHNIQUE: Multiplanar, multiecho pulse sequences of the brain and surrounding structures were obtained without intravenous contrast. Angiographic images of the head were obtained using MRA technique without contrast. COMPARISON:  Head CT 08/02/2017.  MRI 11/10/2009. FINDINGS: MRI HEAD FINDINGS Brain: Diffusion imaging shows a 4-5 mm acute infarction within the medial right parietal lobe. No other acute infarction. There chronic small-vessel ischemic changes of the pons. No focal cerebellar insult. There are old small vessel infarctions affecting the thalami, basal ganglia and throughout the cerebral hemispheric deep and subcortical white matter. No large vessel territory infarction. No mass lesion, hemorrhage, hydrocephalus or extra-axial collection. The findings are progressive since the study of 2011. Vascular: Major vessels at the base of the brain show flow. Skull and upper cervical spine: Negative Sinuses/Orbits: Clear/normal Other: None MRA HEAD FINDINGS Both internal  carotid arteries are patent through the skull base and siphon regions. Some artifact in the supraclinoid region prevents accurate evaluation of that segment. The anterior and middle cerebral vessels are patent without proximal stenosis, aneurysm or vascular malformation. Both vertebral arteries are patent to the basilar. No basilar stenosis. Again there is some artifact in the midportion. Superior cerebellar and posterior cerebral arteries are patent. IMPRESSION: 4-5 mm acute infarction within the medial right parietal lobe. Advanced chronic small-vessel ischemic changes elsewhere throughout the brain, with particular involvement of the thalami and basal ganglia. Findings are progressive since 2011. Motion degraded intracranial MR angiography does not suggest any large or medium vessel occlusion or correctable stenosis. See above. Electronically Signed   By: Nelson Chimes M.D.   On: 08/03/2017 08:03   US Renal  Result Date: 08/04/2017 CLINICAL DATA:  Acute renal failure EXAM: RENAL / URINARY TRACT ULTRASOUND COMPLETE COMPARISON:  None. FINDINGS: Right Kidney: Length: 9.3 cm. Echogenicity is increased. Renal cortical thickness is normal. No mass, perinephric fluid, or hydronephrosis visualized. No sonographically demonstrable calculus or ureterectasis. Left Kidney: Length: 9.3 cm. Echogenicity is slightly increased. Renal cortical thickness is normal. No mass, perinephric fluid, or hydronephrosis visualized. No sonographically demonstrable calculus or ureterectasis. Bladder: Appears normal for degree of bladder distention. Prostate measures 5.0 x 3.6 x 4.7 cm with a measured volume of 44 cubic cm. IMPRESSION: Kidneys are somewhat echogenic, a finding concerning for medical renal disease. Renal cortical thickness normal. No obstructing focus in either kidney. Prominent prostate. Study otherwise unremarkable. Electronically Signed   By: Lowella Grip III M.D.   On: 08/04/2017 11:09   US Carotid Bilateral (at  [facility])  Result Date: 08/03/2017 CLINICAL DATA:  CVA and syncope. EXAM: BILATERAL CAROTID DUPLEX ULTRASOUND TECHNIQUE: Pearline Cables scale imaging, color Doppler and duplex ultrasound were performed of bilateral carotid and vertebral arteries in the neck. COMPARISON:  11/09/2009 FINDINGS: Criteria: Quantification of carotid stenosis is based on velocity parameters that correlate the residual internal carotid diameter with NASCET-based stenosis levels, using the diameter of the distal internal carotid lumen as the denominator for stenosis measurement. The following velocity measurements were obtained: RIGHT ICA:  62 cm/sec CCA:  60 cm/sec SYSTOLIC ICA/CCA RATIO:  1.1 DIASTOLIC ICA/CCA RATIO:  1.8 ECA:  116 cm/sec LEFT ICA:  69 cm/sec CCA:  52 cm/sec SYSTOLIC ICA/CCA RATIO:  1.3 DIASTOLIC ICA/CCA RATIO:  2.2 ECA:  99 cm/sec RIGHT CAROTID ARTERY: The right carotid bulb is markedly abnormal and  enlarged. There is a large amount of heterogeneous material surrounding the lumen. Findings are concerning for dilatation of the carotid bulb and a large amount of mural thrombus. Carotid bulb roughly measures up to 2.3 cm and previously measured roughly 1.6 cm. External carotid artery is patent with normal waveform. Irregular plaque and mural thrombus in the proximal internal carotid artery. Normal waveforms and velocities in the internal carotid artery. RIGHT VERTEBRAL ARTERY: Antegrade flow and normal waveform in the right vertebral artery. LEFT CAROTID ARTERY: Again noted is mild ectasia of the left carotid bulb with peripheral plaque. Plaque in the left carotid bulb region is similar to the previous examination. Small amount of plaque at the origin of the external carotid artery. External carotid artery is patent with normal waveform. Small amount of plaque in the proximal internal carotid artery. Normal waveforms and velocities in the internal carotid artery. LEFT VERTEBRAL ARTERY: Antegrade flow and normal waveform in  the left vertebral artery. 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 The right carotid bulb is enlarged measuring up to 2.3 cm. 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 This represents progression of disease from the previous examination. This large amount of mural thrombus and plaque appears to be high risk for embolic disease. Recommend vascular surgery consultation. Estimated degree of stenosis in the internal carotid arteries is less than 50%. Ectasia and plaque at the left carotid bulb is similar to the prior examination. Patent vertebral arteries with antegrade flow. These results will be called to the ordering clinician or representative by the Radiologist Assistant, and communication documented in the PACS or zVision Dashboard. Electronically Signed   By: Markus Daft M.D.   On: 08/03/2017 09:52   Mr Jodene Nam Head/brain [facility] Cm  Result Date: 08/03/2017 CLINICAL DATA:  Weakness, dizziness and blurred vision over the last day. EXAM: MRI HEAD WITHOUT CONTRAST MRA HEAD WITHOUT CONTRAST TECHNIQUE: Multiplanar, multiecho pulse sequences of the brain and surrounding structures were obtained without intravenous contrast. Angiographic images of the head were obtained using MRA technique without contrast. COMPARISON:  Head CT 08/02/2017.  MRI 11/10/2009. FINDINGS: MRI HEAD FINDINGS Brain: Diffusion imaging shows a 4-5 mm acute infarction within the medial right parietal lobe. No other acute infarction. There chronic small-vessel ischemic changes of the pons. No focal cerebellar insult. There are old small vessel infarctions affecting the thalami, basal ganglia and throughout the cerebral hemispheric deep and subcortical white matter. No large vessel territory infarction. No mass lesion, hemorrhage, hydrocephalus or extra-axial collection. The findings are progressive since the study of 2011. Vascular: Major vessels at the  base of the brain show flow. Skull and upper cervical spine: Negative Sinuses/Orbits: Clear/normal Other: None MRA HEAD FINDINGS Both internal carotid arteries are patent through the skull base and siphon regions. Some artifact in the supraclinoid region prevents accurate evaluation of that segment. The anterior and middle cerebral vessels are patent without proximal stenosis, aneurysm or vascular malformation. Both vertebral arteries are patent to the basilar. No basilar stenosis. Again there is some artifact in the midportion. Superior cerebellar and posterior cerebral arteries are patent. IMPRESSION: 4-5 mm acute infarction within the medial right parietal lobe. Advanced chronic small-vessel ischemic changes elsewhere throughout the brain, with particular involvement of the thalami and basal ganglia. Findings are progressive since 2011. Motion degraded intracranial MR angiography does not suggest any large or medium vessel occlusion or correctable stenosis. See above. Electronically Signed  By: Nelson Chimes M.D.   On: 08/03/2017 08:03        Scheduled Meds: . [START ON 08/05/2017] amLODipine  10 mg Oral Daily  . aspirin  300 mg Rectal Daily   Or  . aspirin  325 mg Oral Daily  . atorvastatin  80 mg Oral q1800  . clopidogrel  75 mg Oral Daily  . heparin injection (subcutaneous)  5,000 Units Subcutaneous Q8H  . hydrALAZINE  75 mg Oral TID   Continuous Infusions: . cefTRIAXone (ROCEPHIN)  IV    . nitroGLYCERIN Stopped (08/03/17 1116)     LOS: 2 days    Time spent: 35 minutes    Irine Seal, MD [facility] Pager [phone]  If 7PM-7AM, please contact night-coverage [email or web address] Password [facility] 08/04/2017, 3:30 PM

## 2017-08-04 NOTE — Clinical Social Work Note (Signed)
Patient referred to CSW for assistance with medication. Patient has medicaid.  CM to follow up with to address needs as able.    LCSW signing off.    Khaliah Barnick, Clydene Pugh, LCSW

## 2017-08-05 DIAGNOSIS — N390 Urinary tract infection, site not specified: Secondary | ICD-10-CM | POA: Diagnosis present

## 2017-08-05 DIAGNOSIS — B961 Klebsiella pneumoniae [K. pneumoniae] as the cause of diseases classified elsewhere: Secondary | ICD-10-CM

## 2017-08-05 DIAGNOSIS — B9689 Other specified bacterial agents as the cause of diseases classified elsewhere: Secondary | ICD-10-CM | POA: Diagnosis present

## 2017-08-05 LAB — BASIC METABOLIC PANEL
ANION GAP: 10 (ref 5–15)
BUN: 16 mg/dL (ref 6–20)
CALCIUM: 9.2 mg/dL (ref 8.9–10.3)
CO2: 27 mmol/L (ref 22–32)
CREATININE: 1.71 mg/dL — AB (ref 0.61–1.24)
Chloride: 100 mmol/L — ABNORMAL LOW (ref 101–111)
GFR calc Af Amer: 47 mL/min — ABNORMAL LOW (ref >60)
GFR calc non Af Amer: 41 mL/min — ABNORMAL LOW (ref >60)
GLUCOSE: 108 mg/dL — AB (ref 65–99)
Potassium: 3.6 mmol/L (ref 3.5–5.1)
Sodium: 137 mmol/L (ref 135–145)

## 2017-08-05 LAB — URINE CULTURE: Culture: >=100000 — AB

## 2017-08-05 LAB — CBC
HCT: 36.9 % — ABNORMAL LOW (ref 39.0–52.0)
Hemoglobin: 11.7 g/dL — ABNORMAL LOW (ref 13.0–17.0)
MCH: 27 pg (ref 26.0–34.0)
MCHC: 31.7 g/dL (ref 30.0–36.0)
MCV: 85 fL (ref 78.0–100.0)
PLATELETS: 145 10*3/uL — AB (ref 150–400)
RBC: 4.34 MIL/uL (ref 4.22–5.81)
RDW: 14.5 % (ref 11.5–15.5)
WBC: 9 10*3/uL (ref 4.0–10.5)

## 2017-08-05 MED ORDER — HYDRALAZINE HCL 25 MG PO TABS
100.0000 mg | ORAL_TABLET | Freq: Three times a day (TID) | ORAL | Status: DC
  Administered 2017-08-05 – 2017-08-08 (×11): 100 mg via ORAL
  Filled 2017-08-05 (×11): qty 4

## 2017-08-05 MED ORDER — CEFUROXIME AXETIL 250 MG PO TABS
500.0000 mg | ORAL_TABLET | Freq: Two times a day (BID) | ORAL | Status: DC
  Administered 2017-08-06 – 2017-08-08 (×6): 500 mg via ORAL
  Filled 2017-08-05 (×6): qty 2

## 2017-08-05 NOTE — Progress Notes (Signed)
PROGRESS NOTE    Jeffrey Carney  WNI:[id number] DOB: 12-19-1955 DOA: 08/02/2017 PCP: [name], MD   Brief Narrative:  Patient is a 62 year old gentleman history of CVA, hypertension presented with dizziness which started on the morning of admission around 8 AM.  No vertiginous symptoms.  Patient also complained of a frontal headache with some blurry vision out of the left eye.  Patient denied any focal weakness numbness or slurred speech.  Patient also noted to be in hypertensive urgency and had been noncompliant with his blood pressure medication.  Head CT done was unremarkable.  MRI of the head done consistent with an acute 4-5 mm CVA in the medial right parietal lobe.  MRA did not suggest any large or medium vessel occlusion or correctable stenosis.  2D echo pending.  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  Patient started on aspirin and Plavix.  Neurology consulted.   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  Likely etiology of dizziness.  Patient with some clinical improvement.  Patient denied any asymmetric weakness or numbness.  Patient on admission had complaints of blurry vision.  Stroke workup underway.  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  Spoke with vascular surgery concerning carotid Doppler findings and he was recommended to continue dual antiplatelet treatment as recommended by neurology with outpatient follow-up in  about 3-4 weeks with vascular surgery, Dr. Bridgett Larsson.  2D echo with EF of 60-65%, no wall motion abnormalities, grade 1 diastolic dysfunction, no embolic source noted.  Permissive hypertension with goal blood pressure of 180/90 and titrate slowly over the next 5-7 days.  Patient noted to be in hypertensive emergency on admission with blood pressure as high as 240/173.  Patient placed on a nitroglycerin drip.  Blood pressure improved on nitroglycerin drip as well as hydralazine 25 mg 3 times daily and Norvasc 5 mg daily.  Nitroglycerin drip discontinued.  Systolic blood pressure this morning in the 180s.  Increase hydralazine to 100 mg 3 times daily.  Increased Norvasc to 10 mg daily.  Imdur 30 mg daily started. Fasting lipid panel with a LDL of 69.  This has been discussed and patient has been seen in consultation by neurology who are recommending dual antiplatelet therapy with Plavix and aspirin.  PT/OT.  Neurology following and I appreciate the input and recommendations.  2.  Hypertensive emergency Likely secondary to noncompliance.  On presentation patient was noted to have blood pressure as high as 240/173 on admission.  Patient was placed on a nitroglycerin drip.  Blood pressure improved on nitroglycerin drip and patient started on hydralazine 25 mg 3 times daily.  Systolic blood pressure this morning in the 180s.  Norvasc was increased to 10 mg daily.  Hydralazine increased to 100 mg 3 times daily.  Imdur was started at 30 mg daily.  Permissive hypertension secondary to problem #1.  3.  Dizziness Likely secondary to problems #1 and 2.  Improved.  4.  Acute renal failure on chronic kidney disease stage III Presentation patient noted to have a creatinine of 2.18.  Prior creatinines noted at approximately 1.4 likely baseline.  Likely secondary to a prerenal azotemia.  Patient placed on hydration.  Urinalysis with moderate leukocytes, nitrite negative, too numerous to count WBCs.  Urine cultures with greater  than 100,000 Klebsiella pneumonia.  Urine sodium 126.  Urine creatinine 56.02.  Renal ultrasound with somewhat echogenic kidneys concerning for medical renal disease.  No obstruction focus in either kidney.  Prominent prostate.  Creatinine seems to be plateauing.  Saline lock IV fluids.  Follow.    5.  Hyperglycemia Hemoglobin A1c 5.7.  Follow.    6 Klebsiella pneumonia UTI Urinalysis was abnormal on admission.  Urine cultures with greater than 100,000 Klebsiella pneumonia which is sensitive to the cephalosporins, Bactrim, Zosyn.  Resistant to ampicillin.  Patient on IV Rocephin ( D2/7) will transition to oral Ceftin tomorrow to complete a 7-day course of antibiotic treatment.     DVT prophylaxis: Heparin Code Status: Full Family Communication: Updated patient.  No family at bedside. Disposition Plan: Likely home once stroke workup has been completed and hypertensive emergency resolved hopefully in the next 1-2 days.     Consultants:   Neurology: Dr. Merlene Laughter 08/03/2017  Procedures:   CT head 08/02/2017  MRI/MRA head 08/03/2017  Carotid ultrasound bilateral 08/03/2017  2D echo 08/03/2017  Chest x-ray 08/03/2017  Renal ultrasound 08/04/2017  Antimicrobials:   IV Rocephin 08/04/2017   Subjective: Patient sleeping however easily arousable.  States dizziness has resolved.  Denies any further blurry vision.  No chest pain no shortness of breath.  Tolerating current diet.   Objective: Vitals:   08/05/17 1000 08/05/17 1057 08/05/17 1400 08/05/17 1422  BP: (!) 181/117 (!) 174/110 (!) 197/113 (!) 174/110  Pulse: 72  66   Resp: 20  20   Temp: 98.9 F (37.2 C)  98.2 F (36.8 C)   TempSrc: Oral  Oral   SpO2: 100%  99%   Weight:      Height:       No intake or output data in the 24 hours ending 08/05/17 1723 Filed Weights   08/02/17 1737 08/03/17 1800 08/05/17 0900  Weight: 77.1 kg (170 lb) 70.1 kg (154 lb 8.7 oz) 72.5 kg (159 lb 13.3 oz)    Examination:  General exam:  Appears calm and comfortable  Respiratory system: Lungs clear to auscultation bilaterally.  No crackles, no rhonchi, no wheezing.  Normal respiratory effort.  Cardiovascular system: RRR No m/r/g  No JVD.  No lower extremity edema. Gastrointestinal system: Abdomen is nontender, soft, nondistended, positive bowel sounds.  No hepatosplenomegaly.  Central nervous system: Alert and oriented. No focal neurological deficits. Extremities: Symmetric 5 x 5 power. Skin: No rashes, lesions or ulcers Psychiatry: Judgement and insight appear normal. Mood & affect appropriate.     Data Reviewed: I have personally reviewed following labs and imaging studies  CBC: Recent Labs  Lab 08/02/17 1804 08/03/17 1038 08/04/17 0501 08/05/17 0556  WBC 7.6 7.6 8.0 9.0  NEUTROABS 4.0  --   --   --   HGB 12.8* 11.4* 12.3* 11.7*  HCT 40.5 36.1* 38.8* 36.9*  MCV 86.0 86.0 85.7 85.0  PLT 145* 124* 141* 347*   Basic Metabolic Panel: Recent Labs  Lab 08/02/17 1804 08/03/17 1038 08/04/17 0501 08/05/17 0556  NA 138 142 137 137  K 3.5 3.5 3.6 3.6  CL 101 108 102 100*  CO2 28 24 23 27   GLUCOSE 116* 101* 100* 108*  BUN 19 17 18 16   CREATININE 2.18* 1.70* 1.85* 1.71*  CALCIUM 9.4 8.5* 8.9 9.2   GFR: Estimated Creatinine Clearance: 45.3 mL/min (A) (by C-G formula based on SCr of 1.71 mg/dL (H)). Liver Function Tests: Recent Labs  Lab 08/02/17 1804  AST 16  ALT 8*  ALKPHOS 61  BILITOT 0.6  PROT 8.0  ALBUMIN 3.6   No results for input(s): LIPASE, AMYLASE in the last 168 hours. No results for input(s): AMMONIA in the last 168 hours. Coagulation Profile: No results for input(s): INR, PROTIME in the last 168 hours. Cardiac Enzymes: Recent Labs  Lab 08/03/17 0549  TROPONINI <0.03   BNP (last 3 results) No results for input(s): PROBNP in the last 8760 hours. HbA1C: Recent Labs    08/03/17 0549  HGBA1C 5.7*   CBG: No results for input(s): GLUCAP in the last 168 hours. Lipid Profile: Recent  Labs    08/03/17 0549  CHOL 121  HDL 34*  LDLCALC 69  TRIG 89  CHOLHDL 3.6   Thyroid Function Tests: No results for input(s): TSH, T4TOTAL, FREET4, T3FREE, THYROIDAB in the last 72 hours. Anemia Panel: No results for input(s): VITAMINB12, FOLATE, FERRITIN, TIBC, IRON, RETICCTPCT in the last 72 hours. Sepsis Labs: No results for input(s): PROCALCITON, LATICACIDVEN in the last 168 hours.  Recent Results (from the past 240 hour(s))  Culture, Urine     Status: Abnormal   Collection Time: 08/03/17  5:57 AM  Result Value Ref Range Status   Specimen Description   Final    URINE, CLEAN CATCH Performed at [facility], [address], [place], [place] [address]    Special Requests   Final    NONE Performed at [facility], [address] [place], [place] [address]    Culture >=100,000 COLONIES/mL KLEBSIELLA PNEUMONIAE (A)  Final   Report Status 08/05/2017 FINAL  Final   Organism ID, Bacteria KLEBSIELLA PNEUMONIAE (A)  Final      Susceptibility   Klebsiella pneumoniae - MIC*    AMPICILLIN >=32 RESISTANT Resistant     CEFAZOLIN <=4 SENSITIVE Sensitive     CEFTRIAXONE <=1 SENSITIVE Sensitive     CIPROFLOXACIN <=0.25 SENSITIVE Sensitive     GENTAMICIN <=1 SENSITIVE Sensitive     IMIPENEM <=0.25 SENSITIVE Sensitive     NITROFURANTOIN 64 INTERMEDIATE Intermediate     TRIMETH/SULFA <=20 SENSITIVE Sensitive     AMPICILLIN/SULBACTAM 4 SENSITIVE Sensitive     PIP/TAZO <=4 SENSITIVE Sensitive     Extended ESBL NEGATIVE Sensitive     * >=100,000 COLONIES/mL KLEBSIELLA PNEUMONIAE         Radiology Studies: US Renal  Result Date: 08/04/2017 CLINICAL DATA:  Acute renal failure EXAM: RENAL / URINARY TRACT ULTRASOUND COMPLETE COMPARISON:  None. FINDINGS: Right Kidney: Length: 9.3 cm. Echogenicity is increased. Renal cortical thickness is normal. No mass, perinephric fluid, or hydronephrosis visualized. No sonographically demonstrable calculus or ureterectasis. Left Kidney: Length: 9.3 cm.  Echogenicity is slightly increased. Renal cortical thickness is normal. No mass, perinephric fluid, or hydronephrosis visualized. No sonographically demonstrable calculus or ureterectasis. Bladder: Appears normal for degree of bladder distention. Prostate measures 5.0 x 3.6 x 4.7 cm with a measured volume of 44 cubic cm. IMPRESSION: Kidneys are somewhat echogenic, a finding concerning for medical renal disease. Renal cortical thickness normal. No obstructing focus in either kidney. Prominent prostate. Study otherwise unremarkable. Electronically Signed   By: Lowella Grip III M.D.  On: 08/04/2017 11:09        Scheduled Meds: . amLODipine  10 mg Oral Daily  . aspirin  300 mg Rectal Daily   Or  . aspirin  325 mg Oral Daily  . atorvastatin  80 mg Oral q1800  . clopidogrel  75 mg Oral Daily  . heparin injection (subcutaneous)  5,000 Units Subcutaneous Q8H  . hydrALAZINE  100 mg Oral TID  . isosorbide mononitrate  30 mg Oral Daily   Continuous Infusions: . cefTRIAXone (ROCEPHIN)  IV Stopped (08/05/17 1709)     LOS: 3 days    Time spent: 35 minutes    Irine Seal, MD [facility] Pager [phone]  If 7PM-7AM, please contact night-coverage [email or web address] Password [facility] 08/05/2017, 5:23 PM

## 2017-08-06 LAB — BASIC METABOLIC PANEL
Anion gap: 10 (ref 5–15)
BUN: 18 mg/dL (ref 6–20)
CALCIUM: 9.6 mg/dL (ref 8.9–10.3)
CO2: 26 mmol/L (ref 22–32)
CREATININE: 1.75 mg/dL — AB (ref 0.61–1.24)
Chloride: 103 mmol/L (ref 101–111)
GFR calc Af Amer: 46 mL/min — ABNORMAL LOW (ref >60)
GFR, EST NON AFRICAN AMERICAN: 40 mL/min — AB (ref >60)
GLUCOSE: 94 mg/dL (ref 65–99)
Potassium: 3.7 mmol/L (ref 3.5–5.1)
Sodium: 139 mmol/L (ref 135–145)

## 2017-08-06 MED ORDER — ISOSORBIDE MONONITRATE ER 60 MG PO TB24
30.0000 mg | ORAL_TABLET | Freq: Once | ORAL | Status: AC
  Administered 2017-08-06: 30 mg via ORAL
  Filled 2017-08-06: qty 1

## 2017-08-06 MED ORDER — CARVEDILOL 3.125 MG PO TABS
3.1250 mg | ORAL_TABLET | Freq: Two times a day (BID) | ORAL | Status: DC
  Administered 2017-08-06 – 2017-08-08 (×6): 3.125 mg via ORAL
  Filled 2017-08-06 (×6): qty 1

## 2017-08-06 MED ORDER — ISOSORBIDE MONONITRATE ER 60 MG PO TB24
60.0000 mg | ORAL_TABLET | Freq: Every day | ORAL | Status: DC
  Administered 2017-08-07 – 2017-08-08 (×2): 60 mg via ORAL
  Filled 2017-08-06 (×2): qty 1

## 2017-08-06 NOTE — Progress Notes (Signed)
PROGRESS NOTE    Jeffrey Carney  PFX:[id number] DOB: 1956/01/09 DOA: 08/02/2017 PCP: [name], MD   Brief Narrative:  Patient is a 62 year old gentleman history of CVA, hypertension presented with dizziness which started on the morning of admission around 8 AM.  No vertiginous symptoms.  Patient also complained of a frontal headache with some blurry vision out of the left eye.  Patient denied any focal weakness numbness or slurred speech.  Patient also noted to be in hypertensive urgency and had been noncompliant with his blood pressure medication.  Head CT done was unremarkable.  MRI of the head done consistent with an acute 4-5 mm CVA in the medial right parietal lobe.  MRA did not suggest any large or medium vessel occlusion or correctable stenosis.  2D echo pending.  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  Patient started on aspirin and Plavix.  Neurology consulted.   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  Likely etiology of dizziness.  Patient with some clinical improvement.  Patient denied any asymmetric weakness or numbness.  Patient on admission had complaints of blurry vision.  Stroke workup done.  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  Spoke with vascular surgery concerning carotid Doppler findings and he was recommended to continue dual antiplatelet treatment as recommended by neurology with outpatient follow-up in  about 3-4 weeks with vascular surgery, Dr. Bridgett Larsson.  2D echo with EF of 60-65%, no wall motion abnormalities, grade 1 diastolic dysfunction, no embolic source noted.  Permissive hypertension with goal blood pressure of 180/90 and titrate slowly over the next 4-7 days.  Patient noted to be in hypertensive emergency on admission with blood pressure as high as 240/173.  Patient placed on a nitroglycerin drip.  Blood pressure improved on nitroglycerin drip as well as hydralazine 25 mg 3 times daily and Norvasc 5 mg daily.  Nitroglycerin drip discontinued.  Systolic blood pressure this morning in the 180-190s.  Increased hydralazine to 100 mg 3 times daily.  Increased Norvasc to 10 mg daily.  Imdur 30 mg daily started.  Will start low-dose Coreg 3.125 mg twice daily for better blood pressure control.  Fasting lipid panel with a LDL of 69 and at goal.  This has been discussed and patient has been seen in consultation by neurology who are recommending dual antiplatelet therapy with Plavix and aspirin.  PT/OT.  Neurology following and I appreciate the input and recommendations.  2.  Hypertensive emergency Likely secondary to noncompliance.  On presentation patient was noted to have blood pressure as high as 240/173 on admission.  Patient was placed on a nitroglycerin drip.  Blood pressure improved on nitroglycerin drip and patient started on hydralazine 25 mg 3 times daily.  Systolic blood pressure this morning in the 180-190s.  Norvasc was increased to 10 mg daily.  Hydralazine increased to 100 mg 3 times daily.  Imdur was started at 30 mg daily.  We will add Coreg 3.125 mg twice daily for better blood pressure control.  Permissive hypertension secondary to problem #1.  3.  Dizziness Likely secondary to problems #1 and 2.  Improved.  4.  Acute renal failure on chronic kidney disease stage III Presentation patient noted to have a creatinine of 2.18.  Prior creatinines noted at approximately 1.4 likely baseline.   Likely secondary to a prerenal azotemia.  Patient placed on hydration.  Urinalysis with moderate leukocytes, nitrite negative, too numerous to count WBCs.  Urine cultures with greater than 100,000 Klebsiella pneumonia.  Urine sodium 126.  Urine creatinine 56.02.  Renal ultrasound with somewhat echogenic kidneys concerning for medical renal disease.  No obstruction focus in either kidney.  Prominent prostate.  Creatinine seems to be plateauing.  Saline lock IV fluids.  Follow.    5.  Hyperglycemia Hemoglobin A1c 5.7.  Follow.    6 Klebsiella pneumonia UTI Urinalysis was abnormal on admission.  Urine cultures with greater than 100,000 Klebsiella pneumonia which is sensitive to the cephalosporins, Bactrim, Zosyn.  Resistant to ampicillin.  Patient s/p 2 days of IV Rocephin ( D2/7).  Patient has been transitioned to oral Ceftin antibiotic day #3/7 to complete a 7-day course of antibiotic treatment.     DVT prophylaxis: Heparin Code Status: Full Family Communication: Updated patient.  No family at bedside. Disposition Plan: Likely home once stroke workup has been completed and hypertensive emergency resolved hopefully tomorrow.     Consultants:   Neurology: Dr. Merlene Laughter 08/03/2017  Procedures:   CT head 08/02/2017  MRI/MRA head 08/03/2017  Carotid ultrasound bilateral 08/03/2017  2D echo 08/03/2017  Chest x-ray 08/03/2017  Renal ultrasound 08/04/2017  Antimicrobials:   IV Rocephin 08/04/2017>>>> 08/05/2017  Ceftin 08/06/2017   Subjective: Patient laying in bed.  Denies any chest pain or shortness of breath.  No further dizziness.  No visual changes.  No shortness of breath.  No chest pain.  Systolic blood pressure still in the 180s to the 190s.    Objective: Vitals:   08/05/17 1800 08/05/17 2200 08/06/17 0200 08/06/17 1000  BP: (!) 177/104 (!) 184/119 (!) 184/101 (!) 190/102  Pulse: 68 76 65 65  Resp: 20 19 20 20   Temp: 96.2 F (37 C) 98.5 F (36.9 C) 98.2 F (36.8 C) 98.4 F  (36.9 C)  TempSrc: Oral Oral Oral Oral  SpO2: 100% 100% 99% 100%  Weight:    72.8 kg (160 lb 7.9 oz)  Height:        Intake/Output Summary (Last 24 hours) at 08/06/2017 1054 Last data filed at 08/05/2017 2100 Gross per 24 hour  Intake -  Output 250 ml  Net -250 ml   Filed Weights   08/03/17 1800 08/05/17 0900 08/06/17 1000  Weight: 70.1 kg (154 lb 8.7 oz) 72.5 kg (159 lb 13.3 oz) 72.8 kg (160 lb 7.9 oz)    Examination:  General exam: Appears calm and comfortable.  Respiratory system: lungs clear to auscultation bilaterally no wheezes, no crackles, no rhonchi.  Normal respiratory effort.  Cardiovascular system: Regular rate rhythm no murmurs rubs or gallops.  No lower extremity edema.  No JVD. Gastrointestinal system: Abdomen is soft, nondistended, nontender, positive bowel sounds.  No rebound.  No guarding.  No hepatosplenomegaly.  Central nervous system: Alert and oriented. No focal neurological deficits. Extremities: Symmetric 5 x 5 power. Skin: No rashes, lesions or ulcers Psychiatry: Judgement and insight appear normal. Mood & affect appropriate.     Data Reviewed: I have personally  reviewed following labs and imaging studies  CBC: Recent Labs  Lab 08/02/17 1804 08/03/17 1038 08/04/17 0501 08/05/17 0556  WBC 7.6 7.6 8.0 9.0  NEUTROABS 4.0  --   --   --   HGB 12.8* 11.4* 12.3* 11.7*  HCT 40.5 36.1* 38.8* 36.9*  MCV 86.0 86.0 85.7 85.0  PLT 145* 124* 141* 193*   Basic Metabolic Panel: Recent Labs  Lab 08/02/17 1804 08/03/17 1038 08/04/17 0501 08/05/17 0556 08/06/17 0553  NA 138 142 137 137 139  K 3.5 3.5 3.6 3.6 3.7  CL 101 108 102 100* 103  CO2 28 24 23 27 26   GLUCOSE 116* 101* 100* 108* 94  BUN 19 17 18 16 18   CREATININE 2.18* 1.70* 1.85* 1.71* 1.75*  CALCIUM 9.4 8.5* 8.9 9.2 9.6   GFR: Estimated Creatinine Clearance: 44.5 mL/min (A) (by C-G formula based on SCr of 1.75 mg/dL (H)). Liver Function Tests: Recent Labs  Lab 08/02/17 1804  AST 16    ALT 8*  ALKPHOS 61  BILITOT 0.6  PROT 8.0  ALBUMIN 3.6   No results for input(s): LIPASE, AMYLASE in the last 168 hours. No results for input(s): AMMONIA in the last 168 hours. Coagulation Profile: No results for input(s): INR, PROTIME in the last 168 hours. Cardiac Enzymes: Recent Labs  Lab 08/03/17 0549  TROPONINI <0.03   BNP (last 3 results) No results for input(s): PROBNP in the last 8760 hours. HbA1C: No results for input(s): HGBA1C in the last 72 hours. CBG: No results for input(s): GLUCAP in the last 168 hours. Lipid Profile: No results for input(s): CHOL, HDL, LDLCALC, TRIG, CHOLHDL, LDLDIRECT in the last 72 hours. Thyroid Function Tests: No results for input(s): TSH, T4TOTAL, FREET4, T3FREE, THYROIDAB in the last 72 hours. Anemia Panel: No results for input(s): VITAMINB12, FOLATE, FERRITIN, TIBC, IRON, RETICCTPCT in the last 72 hours. Sepsis Labs: No results for input(s): PROCALCITON, LATICACIDVEN in the last 168 hours.  Recent Results (from the past 240 hour(s))  Culture, Urine     Status: Abnormal   Collection Time: 08/03/17  5:57 AM  Result Value Ref Range Status   Specimen Description   Final    URINE, CLEAN CATCH Performed at [facility], [address] [place], [place] [address]    Special Requests   Final    NONE Performed at [facility], [address], [place], [place] [address]    Culture >=100,000 COLONIES/mL KLEBSIELLA PNEUMONIAE (A)  Final   Report Status 08/05/2017 FINAL  Final   Organism ID, Bacteria KLEBSIELLA PNEUMONIAE (A)  Final      Susceptibility   Klebsiella pneumoniae - MIC*    AMPICILLIN >=32 RESISTANT Resistant     CEFAZOLIN <=4 SENSITIVE Sensitive     CEFTRIAXONE <=1 SENSITIVE Sensitive     CIPROFLOXACIN <=0.25 SENSITIVE Sensitive     GENTAMICIN <=1 SENSITIVE Sensitive     IMIPENEM <=0.25 SENSITIVE Sensitive     NITROFURANTOIN 64 INTERMEDIATE Intermediate     TRIMETH/SULFA <=20 SENSITIVE Sensitive     AMPICILLIN/SULBACTAM 4  SENSITIVE Sensitive     PIP/TAZO <=4 SENSITIVE Sensitive     Extended ESBL NEGATIVE Sensitive     * >=100,000 COLONIES/mL KLEBSIELLA PNEUMONIAE         Radiology Studies: US Renal  Result Date: 08/04/2017 CLINICAL DATA:  Acute renal failure EXAM: RENAL / URINARY TRACT ULTRASOUND COMPLETE COMPARISON:  None. FINDINGS: Right Kidney: Length: 9.3 cm. Echogenicity is increased. Renal cortical thickness is normal. No mass, perinephric fluid,  or hydronephrosis visualized. No sonographically demonstrable calculus or ureterectasis. Left Kidney: Length: 9.3 cm. Echogenicity is slightly increased. Renal cortical thickness is normal. No mass, perinephric fluid, or hydronephrosis visualized. No sonographically demonstrable calculus or ureterectasis. Bladder: Appears normal for degree of bladder distention. Prostate measures 5.0 x 3.6 x 4.7 cm with a measured volume of 44 cubic cm. IMPRESSION: Kidneys are somewhat echogenic, a finding concerning for medical renal disease. Renal cortical thickness normal. No obstructing focus in either kidney. Prominent prostate. Study otherwise unremarkable. Electronically Signed   By: Lowella Grip III M.D.   On: 08/04/2017 11:09        Scheduled Meds: . amLODipine  10 mg Oral Daily  . aspirin  300 mg Rectal Daily   Or  . aspirin  325 mg Oral Daily  . atorvastatin  80 mg Oral q1800  . carvedilol  3.125 mg Oral BID WC  . cefUROXime  500 mg Oral BID WC  . clopidogrel  75 mg Oral Daily  . heparin injection (subcutaneous)  5,000 Units Subcutaneous Q8H  . hydrALAZINE  100 mg Oral TID  . isosorbide mononitrate  30 mg Oral Daily   Continuous Infusions:    LOS: 4 days    Time spent: 35 minutes    Irine Seal, MD [facility] Pager [phone]  If 7PM-7AM, please contact night-coverage [email or web address] Password [facility] 08/06/2017, 10:54 AM

## 2017-08-07 LAB — BASIC METABOLIC PANEL
Anion gap: 10 (ref 5–15)
BUN: 21 mg/dL — ABNORMAL HIGH (ref 6–20)
CHLORIDE: 99 mmol/L — AB (ref 101–111)
CO2: 25 mmol/L (ref 22–32)
CREATININE: 1.8 mg/dL — AB (ref 0.61–1.24)
Calcium: 9.3 mg/dL (ref 8.9–10.3)
GFR calc non Af Amer: 38 mL/min — ABNORMAL LOW (ref >60)
GFR, EST AFRICAN AMERICAN: 44 mL/min — AB (ref >60)
Glucose, Bld: 93 mg/dL (ref 65–99)
POTASSIUM: 3.6 mmol/L (ref 3.5–5.1)
Sodium: 134 mmol/L — ABNORMAL LOW (ref 135–145)

## 2017-08-07 MED ORDER — CLONIDINE HCL 0.1 MG PO TABS
0.1000 mg | ORAL_TABLET | Freq: Two times a day (BID) | ORAL | Status: DC
  Administered 2017-08-08: 0.1 mg via ORAL
  Filled 2017-08-07: qty 1

## 2017-08-07 MED ORDER — CLONIDINE HCL 0.1 MG PO TABS: 0.1000 mg | ORAL_TABLET | Freq: Two times a day (BID) | ORAL | Status: DC

## 2017-08-07 MED ORDER — CLONIDINE HCL 0.1 MG PO TABS
0.1000 mg | ORAL_TABLET | Freq: Every day | ORAL | Status: DC
  Administered 2017-08-07: 0.1 mg via ORAL
  Filled 2017-08-07: qty 1

## 2017-08-07 NOTE — Care Management Note (Addendum)
Case Management Note  Patient Details  Name: Jeffrey Carney MRN: [id number] Date of Birth: 1955/11/11    Expected Discharge Date:       08/07/2017           Expected Discharge Plan:  [place]  In-House Referral:     Discharge planning Services  CM Consult, Medication Assistance, Follow-up appt scheduled  Post Acute Care Choice:  Home Health Choice offered to:  Patient, Spouse  DME Arranged:    DME Agency:     HH Arranged:  RN, Disease Management [place] Agency:  [place]  Status of Service:  Completed, signed off  If discussed at [place] of Stay Meetings, dates discussed:    Additional Comments: Met with patient and significant other at bedside. Patient is currently listed as patient of Dr. [name]. Patient's significant other reports she called Dr. Lysle Morales and the soonest appt. Available was April 2nd.  Patient also interested in [facility] RN for follow up of BP.  In order to have [facility], patient must have an active PCP. Encouraged patient to stay with Dr. [name] to have close hospital follow up appt. And to have [facility]. If still interested in switching to a more local doctor, to do so at April appointment but stay active with Dr. Berdine Addison in the meantime.  Patient agreeable. CM called Dr. Cathey Endow office and scheduled f/u appt for March 4th. Added to AVS. Patient would like [facility] for [facility] RN. [name] of [facility] notified and will obtain orders from chart.  Caleb Prigmore, Chauncey Reading, RN 08/07/2017, 12:10 PM

## 2017-08-07 NOTE — Progress Notes (Signed)
Notified Dr. Grandville Silos of BP 186/130 checked manually. Dr. Grandville Silos stated to recheck BP in one hour

## 2017-08-07 NOTE — Progress Notes (Signed)
Fellows A. Merlene Laughter, MD     [email or web address]          Jeffrey Carney is an 62 y.o. male.   ASSESSMENT/PLAN: 1. 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 A loading dose of Plavix 300 mg is recommended along with continuation of full dose aspirin. He should continue with dual antiplatelet agents afterwards. Urgent consultation with vascular surgery is recommended to assess if the patient needs revascularization procedure/thrombectomy. The patient's blood pressure should be gradually lowered and not abruptly reduced.  2. Multiple lacunar infarcts and severe white matter disease all likely due to poorly controlled hypertension.  3. Chronic renal failure likely hypertensive in nature.  4. Mild cognitive impairment likely due to vascular disease.   He seems a lot better and more responsive today.  He is sitting up eating breakfast.     GENERAL: Pleasant male in no acute distress.   HEENT: Poor dentition; neck is supple; no tremor appreciated  EXTREMITIES: No edema    MENTAL STATUS: Alert and oriented - including month and the his age. Speech - subtle dysarthria although this appears to be baseline, language and cognition are generally intact. Judgment and insight normal.   CRANIAL NERVES: Pupils are equal, round and reactive to light and accomodation; extra ocular movements are full, there is no significant nystagmus; visual fields are full; upper and lower facial muscles are normal in strength and symmetric, there is no flattening of the nasolabial folds; tongue is midline; uvula is midline; shoulder elevation is normal.  MOTOR: Normal tone, bulk and strength; no pronator drift. No drift of the upper lower extremities.  COORDINATION: Left finger to nose is normal, right finger to nose is normal, No rest tremor; no intention tremor; no postural tremor; no  bradykinesia.    Blood pressure (!) 177/117, pulse 62, temperature 98.7 F (37.1 C), temperature source Oral, resp. rate 19, height '5\' 10"'  (1.778 m), weight 156 lb 1.4 oz (70.8 kg), SpO2 98 %.  Past Medical History:  Diagnosis Date  . Hypertension   . Stroke [facility])    TIA    History reviewed. No pertinent surgical history.  Family History  Problem Relation Age of Onset  . Hypertension Mother   . Hypertension Father   . Cancer Brother     Social History:  reports that he has been smoking cigarettes.  he has never used smokeless tobacco. He reports that he drinks alcohol. He reports that he uses drugs. Drug: Marijuana.  Allergies: No Known Allergies  Medications: Prior to Admission medications   Medication Sig Start Date End Date Taking? Authorizing Provider  hydrALAZINE (APRESOLINE) 25 MG tablet Take 1 tablet (25 mg total) by mouth 3 (three) times daily. 05/09/16  Yes Triplett, Tammy, PA-C    Scheduled Meds: . amLODipine  10 mg Oral Daily  . aspirin  300 mg Rectal Daily   Or  . aspirin  325 mg Oral Daily  . atorvastatin  80 mg Oral q1800  . carvedilol  3.125 mg Oral BID WC  . cefUROXime  500 mg Oral BID WC  . clopidogrel  75 mg Oral Daily  . heparin injection (subcutaneous)  5,000 Units Subcutaneous Q8H  . hydrALAZINE  100 mg Oral TID  . isosorbide mononitrate  60 mg Oral Daily   Continuous Infusions:  PRN Meds:.acetaminophen **OR** acetaminophen (TYLENOL) oral liquid 160 mg/5 mL **OR** acetaminophen, alum & mag hydroxide-simeth, labetalol, ondansetron (ZOFRAN) IV  Results for orders placed or performed during the hospital encounter of 08/02/17 (from the past 48 hour(s))  Basic metabolic panel     Status: Abnormal   Collection Time: 08/06/17  5:53 AM  Result Value Ref Range   Sodium 139 135 - 145 mmol/L   Potassium 3.7 3.5 - 5.1 mmol/L   Chloride 103 101 - 111 mmol/L   CO2 26 22 - 32 mmol/L   Glucose, Bld 94 65 - 99 mg/dL   BUN 18 6 - 20 mg/dL    Creatinine, Ser 1.75 (H) 0.61 - 1.24 mg/dL   Calcium 9.6 8.9 - 10.3 mg/dL   GFR calc non Af Amer 40 (L) >60 mL/min   GFR calc Af Amer 46 (L) >60 mL/min    Comment: (NOTE) The eGFR has been calculated using the CKD EPI equation. This calculation has not been validated in all clinical situations. eGFR's persistently <60 mL/min signify possible Chronic Kidney Disease.    Anion gap 10 5 - 15    Comment: Performed at [facility], [address] [place], [place] [address]  Basic metabolic panel     Status: Abnormal   Collection Time: 08/07/17  6:10 AM  Result Value Ref Range   Sodium 134 (L) 135 - 145 mmol/L   Potassium 3.6 3.5 - 5.1 mmol/L   Chloride 99 (L) 101 - 111 mmol/L   CO2 25 22 - 32 mmol/L   Glucose, Bld 93 65 - 99 mg/dL   BUN 21 (H) 6 - 20 mg/dL   Creatinine, Ser 1.80 (H) 0.61 - 1.24 mg/dL   Calcium 9.3 8.9 - 10.3 mg/dL   GFR calc non Af Amer 38 (L) >60 mL/min   GFR calc Af Amer 44 (L) >60 mL/min    Comment: (NOTE) The eGFR has been calculated using the CKD EPI equation. This calculation has not been validated in all clinical situations. eGFR's persistently <60 mL/min signify possible Chronic Kidney Disease.    Anion gap 10 5 - 15    Comment: Performed at [facility], [address], [place], [place] [address]    Studies/Results:  TTE - Left ventricle: The cavity size was normal. Wall thickness was   increased in a pattern of moderate LVH. Systolic function was   normal. The estimated ejection fraction was in the range of 60%   to 65%. Wall motion was normal; there were no regional wall   motion abnormalities. Doppler parameters are consistent with   abnormal left ventricular relaxation (grade 1 diastolic   dysfunction). - Mitral valve: There was trivial regurgitation. - Right atrium: The atrium was at the upper limits of normal in   size. Central venous pressure (est): 3 mm Hg. - Atrial septum: No defect or patent foramen ovale was identified. - Tricuspid valve:  There was trivial regurgitation. - Pulmonary arteries: Systolic pressure could not be accurately   estimated. - Pericardium, extracardiac: There was no pericardial effusion.   CAROTID  RIGHT  ICA:  62 cm/sec  CCA:  60 cm/sec  SYSTOLIC ICA/CCA RATIO:  1.1  DIASTOLIC ICA/CCA RATIO:  1.8  ECA:  116 cm/sec  LEFT  ICA:  69 cm/sec  CCA:  52 cm/sec  SYSTOLIC ICA/CCA RATIO:  1.3  DIASTOLIC ICA/CCA RATIO:  2.2  ECA:  99 cm/sec  RIGHT CAROTID ARTERY: The right carotid bulb is markedly abnormal and enlarged. There is a large amount of heterogeneous material surrounding the lumen. Findings are concerning for dilatation of the carotid bulb and a  large amount of mural thrombus. Carotid bulb roughly measures up to 2.3 cm and previously measured roughly 1.6 cm. External carotid artery is patent with normal waveform. Irregular plaque and mural thrombus in the proximal internal carotid artery. Normal waveforms and velocities in the internal carotid artery.  RIGHT VERTEBRAL ARTERY: Antegrade flow and normal waveform in the right vertebral artery.  LEFT CAROTID ARTERY: Again noted is mild ectasia of the left carotid bulb with peripheral plaque. Plaque in the left carotid bulb region is similar to the previous examination. Small amount of plaque at the origin of the external carotid artery. External carotid artery is patent with normal waveform. Small amount of plaque in the proximal internal carotid artery. Normal waveforms and velocities in the internal carotid artery.  LEFT VERTEBRAL ARTERY: Antegrade flow and normal waveform in the left vertebral artery.  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 The right carotid bulb is enlarged measuring up to 2.3 cm. 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 This represents progression of disease from the previous  examination. This large amount of mural thrombus and plaque appears to be high risk for embolic disease. Recommend vascular surgery consultation.  Estimated degree of stenosis in the internal carotid arteries is less than 50%.        BRAIN MRI MRA FINDINGS: MRI HEAD FINDINGS  Brain: Diffusion imaging shows a 4-5 mm acute infarction within the medial right parietal lobe. No other acute infarction. There chronic small-vessel ischemic changes of the pons. No focal cerebellar insult. There are old small vessel infarctions affecting the thalami, basal ganglia and throughout the cerebral hemispheric deep and subcortical white matter. No large vessel territory infarction. No mass lesion, hemorrhage, hydrocephalus or extra-axial collection. The findings are progressive since the study of 2011.  Vascular: Major vessels at the base of the brain show flow.  Skull and upper cervical spine: Negative  Sinuses/Orbits: Clear/normal  Other: None  MRA HEAD FINDINGS  Both internal carotid arteries are patent through the skull base and siphon regions. Some artifact in the supraclinoid region prevents accurate evaluation of that segment. The anterior and middle cerebral vessels are patent without proximal stenosis, aneurysm or vascular malformation.  Both vertebral arteries are patent to the basilar. No basilar stenosis. Again there is some artifact in the midportion. Superior cerebellar and posterior cerebral arteries are patent.  IMPRESSION: 4-5 mm acute infarction within the medial right parietal lobe. Advanced chronic small-vessel ischemic changes elsewhere throughout the brain, with particular involvement of the thalami and basal ganglia. Findings are progressive since 2011.  Motion degraded intracranial MR angiography does not suggest any large or medium vessel occlusion or correctable stenosis. See above.           THE BRAIN MRI AND MRA REVIEWED IN  PERSON.  There is small increased signal involving the medial parietal lobe on the right side seen on DWI.  This is consistent with small deep white matter infarct.  They are older areas of reduced signal seen on T1 on FLAIR involving the basal ganglia and thalamic regions.  There are multiple of these reasons.  There is even some suggestion of reduced signal involving the pontine region on the left side.  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  No hemorrhages appreciated.  No large vessel occlusive disease is appreciated.      Yael Coppess A. Merlene Laughter, M.D.  [place], Tax adviser  of Psychiatry and Neurology ( Neurology). 08/07/2017, 9:44 AM

## 2017-08-07 NOTE — Progress Notes (Signed)
PROGRESS NOTE    Jeffrey Carney  UXN:[id number] DOB: Apr 19, 1956 DOA: 08/02/2017 PCP: Jeffrey Beard, MD   Brief Narrative:  Patient is a 62 year old gentleman history of CVA, hypertension presented with dizziness which started on the morning of admission around 8 AM.  No vertiginous symptoms.  Patient also complained of a frontal headache with some blurry vision out of the left eye.  Patient denied any focal weakness numbness or slurred speech.  Patient also noted to be in hypertensive urgency and had been noncompliant with his blood pressure medication.  Head CT done was unremarkable.  MRI of the head done consistent with an acute 4-5 mm CVA in the medial right parietal lobe.  MRA did not suggest any large or medium vessel occlusion or correctable stenosis.  2D echo pending.  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  Patient started on aspirin and Plavix.  Neurology consulted.   Assessment & Plan:   Principal Problem:   Stroke [facility]) Active Problems:   Hypertensive emergency   Dizziness   Hypertensive urgency   ARF (acute renal failure) (HCC)   CVA (cerebral vascular accident) ([place])   UTI due to Klebsiella species   #1 acute symptomatic carotid bulb thrombosis on the right leading to acute 4-5 mm CVA right medial parietal lobe Per MRI head.  Likely etiology of dizziness.  Patient with some clinical improvement.  Patient denied any asymmetric weakness or numbness.  Patient on admission had complaints of blurry vision.  Stroke workup done.  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  Spoke with vascular surgery concerning carotid Doppler findings and he was recommended to continue dual antiplatelet treatment as recommended by neurology with outpatient follow-up in  about 3-4 weeks with vascular surgery, Dr. Bridgett Larsson.  2D echo with EF of 60-65%, no wall motion abnormalities, grade 1 diastolic dysfunction, no embolic source noted.  Permissive hypertension with goal blood pressure of 180/90 and titrate slowly over the next 4-7 days.  Patient noted to be in hypertensive emergency on admission with blood pressure as high as 240/173.  Patient placed on a nitroglycerin drip.  Blood pressure improved on nitroglycerin drip as well as hydralazine 25 mg 3 times daily and Norvasc 5 mg daily.  Nitroglycerin drip discontinued.  Systolic blood pressure this morning in the 180-190s.  Increased hydralazine to 100 mg 3 times daily.  Increased Norvasc to 10 mg daily.  Imdur 60 mg daily. Started on low-dose Coreg 3.125 mg twice daily for better blood pressure control.  Add clonidine 0.1 mg twice daily for better blood pressure control.  Fasting lipid panel with a LDL of 69 and at goal.  Continue statin.  This has been discussed and patient has been seen in consultation by neurology who are recommending dual antiplatelet therapy with Plavix and aspirin.  PT/OT.  Neurology following and I appreciate the input and recommendations.  2.  Hypertensive emergency Likely secondary to noncompliance.  On presentation patient was noted to have blood pressure as high as 240/173 on admission.  Patient was placed on a nitroglycerin drip.  Blood pressure improved on nitroglycerin drip and patient started on hydralazine 25 mg 3 times daily.  Systolic blood pressure this morning in the 180-190s.  Norvasc was increased to 10 mg daily.  Hydralazine increased to  100 mg 3 times daily.  Imdur was increased to 60 mg daily.  Coreg started at 3.125 mg twice daily.  Patient still with systolic blood pressures in the 200s.  We will add clonidine 0.1 mg twice daily.  May need evaluation in the outpatient setting for secondary causes of hypertension.   3.  Dizziness Likely secondary to problems #1 and 2.  Improved.  4.   Acute renal failure on chronic kidney disease stage III Presentation patient noted to have a creatinine of 2.18.  Prior creatinines noted at approximately 1.4 likely baseline.  Likely secondary to a prerenal azotemia.  Patient placed on hydration.  Urinalysis with moderate leukocytes, nitrite negative, too numerous to count WBCs.  Urine cultures with greater than 100,000 Klebsiella pneumonia.  Urine sodium 126.  Urine creatinine 56.02.  Renal ultrasound with somewhat echogenic kidneys concerning for medical renal disease.  No obstruction focus in either kidney.  Prominent prostate.  Creatinine seems to be plateauing.  Saline lock IV fluids.  Follow.    5.  Hyperglycemia Hemoglobin A1c 5.7.  Follow.    6 Klebsiella pneumonia UTI Urinalysis was abnormal on admission.  Urine cultures with greater than 100,000 Klebsiella pneumonia which is sensitive to the cephalosporins, Bactrim, Zosyn.  Resistant to ampicillin.  Patient s/p 2 days of IV Rocephin ( D2/7).  Patient has been transitioned to oral Ceftin antibiotic day #4/7 to complete a 7-day course of antibiotic treatment.     DVT prophylaxis: Heparin Code Status: Full Family Communication: Updated patient.  No family at bedside. Disposition Plan: Likely home once stroke workup has been completed and hypertensive emergency resolved hopefully in 24-48 hours..     Consultants:   Neurology: Dr. Merlene Laughter 08/03/2017  Procedures:   CT head 08/02/2017  MRI/MRA head 08/03/2017  Carotid ultrasound bilateral 08/03/2017  2D echo 08/03/2017  Chest x-ray 08/03/2017  Renal ultrasound 08/04/2017  Antimicrobials:   IV Rocephin 08/04/2017>>>> 08/05/2017  Ceftin 08/06/2017   Subjective: Patient in bed.  No chest pain.  No headache.  No shortness of breath.  No visual changes.  Feels close to his baseline.  Systolic blood pressures in the 180s.  Recent last blood pressure 210/130.   Objective: Vitals:   08/06/17 2200 08/07/17 0200 08/07/17 0453 08/07/17  1000  BP: (!) 179/101 (!) 177/117  (!) 183/99  Pulse: 62 62  63  Resp: 20 19  18   Temp: 98.2 F (36.8 C) 98.7 F (37.1 C)  98.4 F (36.9 C)  TempSrc: Oral Oral  Oral  SpO2: 97% 98%  100%  Weight:   70.8 kg (156 lb 1.4 oz)   Height:        Intake/Output Summary (Last 24 hours) at 08/07/2017 1118 Last data filed at 08/07/2017 0900 Gross per 24 hour  Intake 480 ml  Output 1050 ml  Net -570 ml   Filed Weights   08/05/17 0900 08/06/17 1000 08/07/17 0453  Weight: 72.5 kg (159 lb 13.3 oz) 72.8 kg (160 lb 7.9 oz) 70.8 kg (156 lb 1.4 oz)    Examination:  General exam: Appears calm and comfortable.  Respiratory system: CTAB. No wheezes, no rhonchi, no crackles. Normal respiratory effort.  Cardiovascular system: RRR. No m/r/g. No JVD. No LE edema. Gastrointestinal system: Abdomen is nontender, nondistended, soft, positive bowel sounds.  No rebound.  No guarding. Central nervous system: Alert and oriented. No focal neurological deficits. Extremities: Symmetric 5 x 5 power. Skin: No rashes, lesions or ulcers Psychiatry: Judgement and insight appear normal. Mood &  affect appropriate.     Data Reviewed: I have personally reviewed following labs and imaging studies  CBC: Recent Labs  Lab 08/02/17 1804 08/03/17 1038 08/04/17 0501 08/05/17 0556  WBC 7.6 7.6 8.0 9.0  NEUTROABS 4.0  --   --   --   HGB 12.8* 11.4* 12.3* 11.7*  HCT 40.5 36.1* 38.8* 36.9*  MCV 86.0 86.0 85.7 85.0  PLT 145* 124* 141* 751*   Basic Metabolic Panel: Recent Labs  Lab 08/03/17 1038 08/04/17 0501 08/05/17 0556 08/06/17 0553 08/07/17 0610  NA 142 137 137 139 134*  K 3.5 3.6 3.6 3.7 3.6  CL 108 102 100* 103 99*  CO2 24 23 27 26 25   GLUCOSE 101* 100* 108* 94 93  BUN 17 18 16 18  21*  CREATININE 1.70* 1.85* 1.71* 1.75* 1.80*  CALCIUM 8.5* 8.9 9.2 9.6 9.3   GFR: Estimated Creatinine Clearance: 42.1 mL/min (A) (by C-G formula based on SCr of 1.8 mg/dL (H)). Liver Function Tests: Recent Labs  Lab  08/02/17 1804  AST 16  ALT 8*  ALKPHOS 61  BILITOT 0.6  PROT 8.0  ALBUMIN 3.6   No results for input(s): LIPASE, AMYLASE in the last 168 hours. No results for input(s): AMMONIA in the last 168 hours. Coagulation Profile: No results for input(s): INR, PROTIME in the last 168 hours. Cardiac Enzymes: Recent Labs  Lab 08/03/17 0549  TROPONINI <0.03   BNP (last 3 results) No results for input(s): PROBNP in the last 8760 hours. HbA1C: No results for input(s): HGBA1C in the last 72 hours. CBG: No results for input(s): GLUCAP in the last 168 hours. Lipid Profile: No results for input(s): CHOL, HDL, LDLCALC, TRIG, CHOLHDL, LDLDIRECT in the last 72 hours. Thyroid Function Tests: No results for input(s): TSH, T4TOTAL, FREET4, T3FREE, THYROIDAB in the last 72 hours. Anemia Panel: No results for input(s): VITAMINB12, FOLATE, FERRITIN, TIBC, IRON, RETICCTPCT in the last 72 hours. Sepsis Labs: No results for input(s): PROCALCITON, LATICACIDVEN in the last 168 hours.  Recent Results (from the past 240 hour(s))  Culture, Urine     Status: Abnormal   Collection Time: 08/03/17  5:57 AM  Result Value Ref Range Status   Specimen Description   Final    URINE, CLEAN CATCH Performed at [facility], [address], [place], [place] [address]    Special Requests   Final    NONE Performed at [facility], [address] [place], [place] [address]    Culture >=100,000 COLONIES/mL KLEBSIELLA PNEUMONIAE (A)  Final   Report Status 08/05/2017 FINAL  Final   Organism ID, Bacteria KLEBSIELLA PNEUMONIAE (A)  Final      Susceptibility   Klebsiella pneumoniae - MIC*    AMPICILLIN >=32 RESISTANT Resistant     CEFAZOLIN <=4 SENSITIVE Sensitive     CEFTRIAXONE <=1 SENSITIVE Sensitive     CIPROFLOXACIN <=0.25 SENSITIVE Sensitive     GENTAMICIN <=1 SENSITIVE Sensitive     IMIPENEM <=0.25 SENSITIVE Sensitive     NITROFURANTOIN 64 INTERMEDIATE Intermediate     TRIMETH/SULFA <=20 SENSITIVE Sensitive      AMPICILLIN/SULBACTAM 4 SENSITIVE Sensitive     PIP/TAZO <=4 SENSITIVE Sensitive     Extended ESBL NEGATIVE Sensitive     * >=100,000 COLONIES/mL KLEBSIELLA PNEUMONIAE         Radiology Studies: No results found.      Scheduled Meds: . amLODipine  10 mg Oral Daily  . aspirin  300 mg Rectal Daily   Or  .  aspirin  325 mg Oral Daily  . atorvastatin  80 mg Oral q1800  . carvedilol  3.125 mg Oral BID WC  . cefUROXime  500 mg Oral BID WC  . clopidogrel  75 mg Oral Daily  . heparin injection (subcutaneous)  5,000 Units Subcutaneous Q8H  . hydrALAZINE  100 mg Oral TID  . isosorbide mononitrate  60 mg Oral Daily   Continuous Infusions:    LOS: 5 days    Time spent: 35 minutes    Irine Seal, MD [facility] Pager [phone]  If 7PM-7AM, please contact night-coverage [email or web address] Password [email or web address] 08/07/2017, 11:18 AM

## 2017-08-08 DIAGNOSIS — I160 Hypertensive urgency: Secondary | ICD-10-CM

## 2017-08-08 LAB — BASIC METABOLIC PANEL
ANION GAP: 10 (ref 5–15)
BUN: 22 mg/dL — ABNORMAL HIGH (ref 6–20)
CHLORIDE: 99 mmol/L — AB (ref 101–111)
CO2: 24 mmol/L (ref 22–32)
Calcium: 9.5 mg/dL (ref 8.9–10.3)
Creatinine, Ser: 1.8 mg/dL — ABNORMAL HIGH (ref 0.61–1.24)
GFR calc Af Amer: 44 mL/min — ABNORMAL LOW (ref >60)
GFR, EST NON AFRICAN AMERICAN: 38 mL/min — AB (ref >60)
GLUCOSE: 88 mg/dL (ref 65–99)
POTASSIUM: 3.7 mmol/L (ref 3.5–5.1)
Sodium: 133 mmol/L — ABNORMAL LOW (ref 135–145)

## 2017-08-08 LAB — CBC
HCT: 39.6 % (ref 39.0–52.0)
HEMOGLOBIN: 12.5 g/dL — AB (ref 13.0–17.0)
MCH: 26.9 pg (ref 26.0–34.0)
MCHC: 31.6 g/dL (ref 30.0–36.0)
MCV: 85.3 fL (ref 78.0–100.0)
PLATELETS: 159 10*3/uL (ref 150–400)
RBC: 4.64 MIL/uL (ref 4.22–5.81)
RDW: 14.5 % (ref 11.5–15.5)
WBC: 8.9 10*3/uL (ref 4.0–10.5)

## 2017-08-08 MED ORDER — CLONIDINE HCL 0.1 MG PO TABS: 0.1000 mg | ORAL_TABLET | Freq: Two times a day (BID) | ORAL | 0 refills | Status: DC

## 2017-08-08 MED ORDER — ATORVASTATIN CALCIUM 80 MG PO TABS: 80.0000 mg | ORAL_TABLET | Freq: Every day | ORAL | 0 refills | Status: AC

## 2017-08-08 MED ORDER — CEFUROXIME AXETIL 500 MG PO TABS: 500.0000 mg | ORAL_TABLET | Freq: Two times a day (BID) | ORAL | 0 refills | Status: AC

## 2017-08-08 MED ORDER — CLOPIDOGREL BISULFATE 75 MG PO TABS: 75.0000 mg | ORAL_TABLET | Freq: Every day | ORAL | 1 refills | Status: AC

## 2017-08-08 MED ORDER — ASPIRIN 325 MG PO TABS: 325.0000 mg | ORAL_TABLET | Freq: Every day | ORAL | Status: AC

## 2017-08-08 MED ORDER — CARVEDILOL 3.125 MG PO TABS: 3.1250 mg | ORAL_TABLET | Freq: Two times a day (BID) | ORAL | 0 refills | Status: AC

## 2017-08-08 MED ORDER — HYDRALAZINE HCL 100 MG PO TABS: 100.0000 mg | ORAL_TABLET | Freq: Three times a day (TID) | ORAL | 0 refills | Status: AC

## 2017-08-08 MED ORDER — ISOSORBIDE MONONITRATE ER 60 MG PO TB24: 60.0000 mg | ORAL_TABLET | Freq: Every day | ORAL | 0 refills | Status: AC

## 2017-08-08 MED ORDER — AMLODIPINE BESYLATE 10 MG PO TABS: 10.0000 mg | ORAL_TABLET | Freq: Every day | ORAL | 0 refills | Status: AC

## 2017-08-08 NOTE — Progress Notes (Signed)
IV removed, 2x2 gauze and paper tape applied to site, patient tolerated well. Reviewed AVS with patient who verbalized understanding.  Hard copy prescriptions given to patient.  Patient awaiting wife to arrive to transport home.

## 2017-08-08 NOTE — Discharge Instructions (Signed)
Ischemic Stroke °An ischemic stroke is the sudden death of brain tissue. Blood carries oxygen to all areas of the body. This type of stroke happens when your blood does not flow to your brain like normal. Your brain cannot get the oxygen it needs. This is an emergency. It must be treated right away. °Symptoms of a stroke usually happen all of a sudden. You may notice them when you wake up. They can include: °· Weakness or loss of feeling in your face, arm, or leg. This often happens on one side of the body. °· Trouble walking. °· Trouble moving your arms or legs. °· Loss of balance or coordination. °· Feeling confused. °· Trouble talking or understanding what people are saying. °· Slurred speech. °· Trouble seeing. °· Seeing two of one object (double vision). °· Feeling dizzy. °· Feeling sick to your stomach (nauseous) and throwing up (vomiting). °· A very bad headache for no reason. ° °Get help as soon as any of these problems start. This is important. Some treatments work better if they are given right away. These include: °· Aspirin. °· Medicines to control blood pressure. °· A shot (injection) of medicine to break up the blood clot. °· Treatments given in the blood vessel (artery) to take out the clot or break it up. ° °Other treatments may include: °· Oxygen. °· Fluids given through an IV tube. °· Medicines to thin out your blood. °· Procedures to help your blood flow better. ° °What increases the risk? °Certain things may make you more likely to have a stroke. Some of these are things that you can change, such as: °· Being very overweight (obesity). °· Smoking. °· Taking birth control pills. °· Not being active. °· Drinking too much alcohol. °· Using drugs. ° °Other risk factors include: °· High blood pressure. °· High cholesterol. °· Diabetes. °· Heart disease. °· Being African American, Native American, Hispanic, or [place] Native. °· Being over age 60. °· Family history of stroke. °· Having had blood clots,  stroke, or warning stroke (transient ischemic attack, TIA) in the past. °· Sickle cell disease. °· Being a woman with a history of high blood pressure in pregnancy (preeclampsia). °· Migraine headache. °· Sleep apnea. °· Having an irregular heartbeat (atrial fibrillation). °· Long-term (chronic) diseases that cause soreness and swelling (inflammation). °· Disorders that affect how your blood clots. ° °Follow these instructions at home: °Medicines °· Take over-the-counter and prescription medicines only as told by your doctor. °· If you were told to take aspirin or another medicine to thin your blood, take it exactly as told by your doctor. °? Taking too much of the medicine can cause bleeding. °? If you do not take enough, it may not work as well. °· Know the side effects of your medicines. If you are taking a blood thinner, make sure you: °? Hold pressure over any cuts for longer than usual. °? Tell your dentist and other doctors that you take this medicine. °? Avoid activities that may cause damage or injury to your body. °Eating and drinking °· Follow instructions from your doctor about what you cannot eat or drink. °· Eat healthy foods. °· If you have trouble with swallowing, do these things to avoid choking: °? Take small bites when eating. °? Eat foods that are soft or pureed. °Safety °· Follow instructions from your health care team about physical activity. °· Use a walker or cane as told by your doctor. °· Keep your home safe so   you do not fall. This may include: °? Having experts look at your home to make sure it is safe. °? Putting grab bars in the bedroom and bathroom. °? Using raised toilets. °? Putting a seat in the shower. °General instructions °· Do not use any tobacco products. °? Examples of these are cigarettes, chewing tobacco, and e-cigarettes. °? If you need help quitting, ask your doctor. °· Limit how much alcohol you drink. This means no more than 1 drink a day for nonpregnant women and 2  drinks a day for men. One drink equals 12 oz of beer, 5 oz of wine, or 1½ oz of hard liquor. °· If you need help to stop using drugs or alcohol, ask your doctor to refer you to a program or specialist. °· Stay active. Exercise as told by your doctor. °· Keep all follow-up visits as told by your doctor. This is important. °Get help right away if: °· You suddenly: °? Have weakness or loss of feeling in your face, arm, or leg. °? Feel confused. °? Have trouble talking or understanding what people are saying. °? Have trouble seeing. °? Have trouble walking. °? Have trouble moving your arms or legs. °? Feel dizzy. °? Lose your balance or coordination. °? Have a very bad headache and you do not know why. °· You pass out (lose consciousness) or almost pass out. °· You have jerky movements that you cannot control (seizure). °These symptoms may be an emergency. Do not wait to see if the symptoms will go away. Get medical help right away. Call your local emergency services (911 in the [place] Do not drive yourself to the hospital. °This information is not intended to replace advice given to you by your health care provider. Make sure you discuss any questions you have with your health care provider. °Document Released: 05/19/2011 Document Revised: 11/10/2015 Document Reviewed: 08/26/2015 °[employer] Patient Education © 2018 [employer] ° °

## 2017-08-08 NOTE — Discharge Summary (Signed)
Physician Discharge Summary  Jeffrey Carney DPO:[id number] DOB: 1956-06-01 DOA: 08/02/2017  PCP: Jeffrey Beard, MD  Admit date: 08/02/2017 Discharge date: 08/08/2017  Time spent: 65 minutes  Recommendations for Outpatient Follow-up:  1 follow-up with Dr. 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  Patient is being discharged on dual antiplatelet therapy.  2.  Follow-up with Dr. Merlene Carney, neurology in 1 month.  3.  Follow-up with Jeffrey Beard, MD on August 14, 2017.  On follow-up patient will need a basic metabolic profile done to follow-up on electrolytes and renal function.  Patient's blood pressure need to be reassessed and antihypertensive medications adjusted appropriately.  May consider further workup for secondary causes of hypertension.    Discharge Diagnoses:  Principal Problem:   Stroke [facility]) Active Problems:   Hypertensive emergency   Dizziness   Hypertensive urgency   ARF (acute renal failure) (HCC)   CVA (cerebral vascular accident) ([place])   UTI due to Klebsiella species   Discharge Condition: Stable and improved  Diet recommendation: Heart healthy  Filed Weights   08/06/17 1000 08/07/17 0453 08/08/17 0600  Weight: 72.8 kg (160 lb 7.9 oz) 70.8 kg (156 lb 1.4 oz) 71.6 kg (157 lb 13.6 oz)    History of present illness:  Per Dr Jeffrey Carney  is a 62 y.o. male, w hx of CVA, hypertension apparently c/o dizziness starting about 8am.  No vertigo.  Slight headache "frontal", slight blurred vision out of left eye.  Pt stated that dizziness mostly subsided. Still had blurred vision. Pt denied any focal weakness, numbness, tingling, slurred speech.  Pt denied cp, palp, sob, lower ext edema. Pt stated that he had not been taking his bp medication. Pt not a TPA candidate due to outside TPA window  In Ed,  CT brain IMPRESSION: 1. No definite CT evidence for acute intracranial abnormality 2. Moderate small  vessel ischemic changes of the white matter, progression since prior study. Numerous old appearing lacunar infarcts in the basal ganglia and thalamus, some of which are new compared to the prior CT 3. Atrophy  Wbc 7.6, hgb 12.8, Plt 145  Na 138, K 3.5, Bun 19, Creatinine 2.18 Ast 16, Alt 8   Pt was admitted for dizziness, r/o CVA, and for hypertensive urgency/emergency, and ARF.      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  Likely etiology of dizziness.  Patient with clinical improvement.  Patient denied any asymmetric weakness or numbness.  Patient on admission had complaints of blurry vision.  Stroke workup done.  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  Spoke with vascular surgery concerning carotid Doppler findings and he was recommended to continue dual antiplatelet treatment as recommended by neurology with outpatient follow-up in about 3-4 weeks with vascular surgery, Dr. Bridgett Carney.  2D echo with EF of 60-65%, no wall motion abnormalities, grade 1 diastolic dysfunction, no embolic source noted.  Permissive hypertension with goal blood pressure of 180/90 and titrate slowly over the next 4-7 days during the hospitalization.  Patient noted to be in hypertensive emergency on admission with blood pressure as high as 240/173.  Patient placed on a nitroglycerin drip.  Blood pressure improved on nitroglycerin drip as  well as hydralazine 25 mg 3 times daily and Norvasc 5 mg daily.  Nitroglycerin drip discontinued.  Systolic blood pressure increased and remained in the 180-190s.  Increased hydralazine to 100 mg 3 times daily.  Increased Norvasc to 10 mg daily.  Imdur 60 mg daily. Started on low-dose Coreg 3.125 mg twice daily for better blood pressure control.  Added clonidine  0.1 mg twice daily for better blood pressure control.  Systolic blood pressure by day of discharge was in the 160s. Fasting lipid panel with a LDL of 69 and at goal.  Patient maintained on a statin. Case was discussed and patient has been seen in consultation by neurology who are recommending dual antiplatelet therapy with Plavix and aspirin.  PT/OT.    Outpatient follow-up with neurology and vascular surgery.   2.  Hypertensive emergency Likely secondary to noncompliance.  On presentation patient was noted to have blood pressure as high as 240/173 on admission.  Patient was placed on a nitroglycerin drip.  Blood pressure improved on nitroglycerin drip and patient started on hydralazine 25 mg 3 times daily.  Systolic blood pressure remained in the 180s to the 190s.  Norvasc was increased to 10 mg daily.  Hydralazine increased to 100 mg 3 times daily.  Imdur was increased to 60 mg daily.  Coreg started at 3.125 mg twice daily.  Patient still with systolic blood pressures in the 200s.  Clonidine 0.1 mg twice daily was subsequently added to blood pressure regimen with systolic blood pressures in the 160s.  Patient be discharged home on his current regimen and will follow up with PCP in the outpatient setting for further titration of his antihypertensive medications.  Patient may need further workup in the outpatient setting for secondary causes of hypertension.  Will defer to PCP.   3.  Dizziness Likely secondary to problems #1 and 2.   4.  Acute renal failure on chronic kidney disease stage III Presentation patient noted to have a creatinine of 2.18.  Prior creatinines noted at approximately 1.4.  Likely secondary to a prerenal azotemia.  Patient placed on hydration.  Urinalysis with moderate leukocytes, nitrite negative, too numerous to count WBCs.  Urine cultures with greater than 100,000 Klebsiella pneumonia.  Urine sodium 126.  Urine creatinine 56.02.  Renal ultrasound with somewhat echogenic kidneys  concerning for medical renal disease.  No obstruction focus in either kidney.  Prominent prostate.  She was hydrated gently with IV fluids and creatinine seems to have plateaued in approximately 1.8 range.   With good urine output.  Outpatient follow-up with PCP.     5.  Hyperglycemia Hemoglobin A1c 5.7.  Outpatient follow-up.  6 Klebsiella pneumonia UTI Urinalysis was abnormal on admission.  Urine cultures with greater than 100,000 Klebsiella pneumonia which was sensitive to the cephalosporins, Bactrim, Zosyn.  Resistant to ampicillin.  Patient s/p 2 days of IV Rocephin ( D2/7).  Patient has been transitioned to oral Ceftin and patient will be discharged on 4 more days of oral Ceftin to complete a one-week course of antibiotic treatment.       Procedures:  CT head 08/02/2017  MRI/MRA head 08/03/2017  Carotid ultrasound bilateral 08/03/2017  2D echo 08/03/2017  Chest x-ray 08/03/2017  Renal ultrasound 08/04/2017      Consultations:  Neurology: Dr. Merlene Carney 08/03/2017      Discharge Exam: Vitals:   08/08/17 1000 08/08/17 1400  BP: (!) 174/108 (!) 166/110  Pulse: 65 64  Resp: 18 18  Temp: (!) 97.5 F (  36.4 C) 98.3 F (36.8 C)  SpO2: 100% 100%    General: NAD Cardiovascular: RRR Respiratory: CTAB  Discharge Instructions   Discharge Instructions    Diet - low sodium heart healthy   Complete by:  As directed    Increase activity slowly   Complete by:  As directed      Allergies as of 08/08/2017   No Known Allergies     Medication List    TAKE these medications   amLODipine 10 MG tablet Commonly known as:  NORVASC Take 1 tablet (10 mg total) by mouth daily. Start taking on:  08/09/2017   aspirin 325 MG tablet Take 1 tablet (325 mg total) by mouth daily. Start taking on:  08/09/2017   atorvastatin 80 MG tablet Commonly known as:  LIPITOR Take 1 tablet (80 mg total) by mouth daily at 6 PM.   carvedilol 3.125 MG tablet Commonly known as:   COREG Take 1 tablet (3.125 mg total) by mouth 2 (two) times daily with a meal.   cefUROXime 500 MG tablet Commonly known as:  CEFTIN Take 1 tablet (500 mg total) by mouth 2 (two) times daily with a meal for 4 days.   cloNIDine 0.1 MG tablet Commonly known as:  CATAPRES Take 1 tablet (0.1 mg total) by mouth 2 (two) times daily.   clopidogrel 75 MG tablet Commonly known as:  PLAVIX Take 1 tablet (75 mg total) by mouth daily. Start taking on:  08/09/2017   hydrALAZINE 100 MG tablet Commonly known as:  APRESOLINE Take 1 tablet (100 mg total) by mouth 3 (three) times daily. What changed:    medication strength  how much to take   isosorbide mononitrate 60 MG 24 hr tablet Commonly known as:  IMDUR Take 1 tablet (60 mg total) by mouth daily. Start taking on:  08/09/2017      No Known Allergies Follow-up Information    Jeffrey Beard, MD Follow up.   Specialty:  Family Medicine Why:  March 4th at 2:45, appointment for hospital follow up Contact information: [address] [place] [address] [phone]        Conrad [place], MD. Schedule an appointment as soon as possible for a visit in 3 week(s).   Specialties:  Vascular Surgery, Cardiology Why:  Follow-up in 3-4 weeks. Contact information: [place] [address] [phone]        Phillips Odor, MD. Schedule an appointment as soon as possible for a visit in 1 month(s).   Specialty:  Neurology Contact information: [address] [place] [address] [phone]            The results of significant diagnostics from this hospitalization (including imaging, microbiology, ancillary and laboratory) are listed below for reference.    Significant Diagnostic Studies: Dg Chest 2 View  Result Date: 08/03/2017 CLINICAL DATA:  Dizziness.  History of hypertension. EXAM: CHEST  2 VIEW COMPARISON:  None. FINDINGS: Cardiomediastinal silhouette is normal. No pleural effusions or focal  consolidations. Mild bronchitic changes. Trachea projects midline and there is no pneumothorax. Soft tissue planes and included osseous structures are non-suspicious. Mild degenerative change of the spine. IMPRESSION: Mild bronchitic changes without focal consolidation. Electronically Signed   By: Elon Alas M.D.   On: 08/03/2017 06:23   Ct Head Wo Contrast  Result Date: 08/02/2017 CLINICAL DATA:  Visual changes facial droop elevated BP EXAM: CT HEAD WITHOUT CONTRAST TECHNIQUE: Contiguous axial images were obtained from the base of  the skull through the vertex without intravenous contrast. COMPARISON:  Brain MRI 11/10/2009, 11/08/2009 head CT FINDINGS: Brain: No acute territorial infarction, hemorrhage, or intracranial mass is seen. Moderate hypodensity in the bilateral white matter consistent with small vessel ischemic change, progressed since prior study. Multiple old appearing lacunar infarcts in the bilateral basal ganglia and thalamus, many of which are new compared to the prior CT. Moderate atrophy. Prominent ventricle size likely due to progression of atrophy Vascular: No hyperdense vessels.  No unexpected calcification Skull: Normal. Negative for fracture or focal lesion. Sinuses/Orbits: Mild mucosal thickening in the ethmoid sinuses. No acute orbital abnormality Other: None IMPRESSION: 1. No definite CT evidence for acute intracranial abnormality 2. Moderate small vessel ischemic changes of the white matter, progression since prior study. Numerous old appearing lacunar infarcts in the basal ganglia and thalamus, some of which are new compared to the prior CT 3. Atrophy Electronically Signed   By: Donavan Foil M.D.   On: 08/02/2017 19:19   Mr Brain Wo Contrast  Result Date: 08/03/2017 CLINICAL DATA:  Weakness, dizziness and blurred vision over the last day. EXAM: MRI HEAD WITHOUT CONTRAST MRA HEAD WITHOUT CONTRAST TECHNIQUE: Multiplanar, multiecho pulse sequences of the brain and surrounding  structures were obtained without intravenous contrast. Angiographic images of the head were obtained using MRA technique without contrast. COMPARISON:  Head CT 08/02/2017.  MRI 11/10/2009. FINDINGS: MRI HEAD FINDINGS Brain: Diffusion imaging shows a 4-5 mm acute infarction within the medial right parietal lobe. No other acute infarction. There chronic small-vessel ischemic changes of the pons. No focal cerebellar insult. There are old small vessel infarctions affecting the thalami, basal ganglia and throughout the cerebral hemispheric deep and subcortical white matter. No large vessel territory infarction. No mass lesion, hemorrhage, hydrocephalus or extra-axial collection. The findings are progressive since the study of 2011. Vascular: Major vessels at the base of the brain show flow. Skull and upper cervical spine: Negative Sinuses/Orbits: Clear/normal Other: None MRA HEAD FINDINGS Both internal carotid arteries are patent through the skull base and siphon regions. Some artifact in the supraclinoid region prevents accurate evaluation of that segment. The anterior and middle cerebral vessels are patent without proximal stenosis, aneurysm or vascular malformation. Both vertebral arteries are patent to the basilar. No basilar stenosis. Again there is some artifact in the midportion. Superior cerebellar and posterior cerebral arteries are patent. IMPRESSION: 4-5 mm acute infarction within the medial right parietal lobe. Advanced chronic small-vessel ischemic changes elsewhere throughout the brain, with particular involvement of the thalami and basal ganglia. Findings are progressive since 2011. Motion degraded intracranial MR angiography does not suggest any large or medium vessel occlusion or correctable stenosis. See above. Electronically Signed   By: Nelson Chimes M.D.   On: 08/03/2017 08:03   US Renal  Result Date: 08/04/2017 CLINICAL DATA:  Acute renal failure EXAM: RENAL / URINARY TRACT ULTRASOUND COMPLETE  COMPARISON:  None. FINDINGS: Right Kidney: Length: 9.3 cm. Echogenicity is increased. Renal cortical thickness is normal. No mass, perinephric fluid, or hydronephrosis visualized. No sonographically demonstrable calculus or ureterectasis. Left Kidney: Length: 9.3 cm. Echogenicity is slightly increased. Renal cortical thickness is normal. No mass, perinephric fluid, or hydronephrosis visualized. No sonographically demonstrable calculus or ureterectasis. Bladder: Appears normal for degree of bladder distention. Prostate measures 5.0 x 3.6 x 4.7 cm with a measured volume of 44 cubic cm. IMPRESSION: Kidneys are somewhat echogenic, a finding concerning for medical renal disease. Renal cortical thickness normal. No obstructing focus in either kidney. Prominent prostate.  Study otherwise unremarkable. Electronically Signed   By: Lowella Grip III M.D.   On: 08/04/2017 11:09   US Carotid Bilateral (at [facility] And [facility] Only)  Result Date: 08/03/2017 CLINICAL DATA:  CVA and syncope. EXAM: BILATERAL CAROTID DUPLEX ULTRASOUND TECHNIQUE: Pearline Cables scale imaging, color Doppler and duplex ultrasound were performed of bilateral carotid and vertebral arteries in the neck. COMPARISON:  11/09/2009 FINDINGS: Criteria: Quantification of carotid stenosis is based on velocity parameters that correlate the residual internal carotid diameter with NASCET-based stenosis levels, using the diameter of the distal internal carotid lumen as the denominator for stenosis measurement. The following velocity measurements were obtained: RIGHT ICA:  62 cm/sec CCA:  60 cm/sec SYSTOLIC ICA/CCA RATIO:  1.1 DIASTOLIC ICA/CCA RATIO:  1.8 ECA:  116 cm/sec LEFT ICA:  69 cm/sec CCA:  52 cm/sec SYSTOLIC ICA/CCA RATIO:  1.3 DIASTOLIC ICA/CCA RATIO:  2.2 ECA:  99 cm/sec RIGHT CAROTID ARTERY: The right carotid bulb is markedly abnormal and enlarged. There is a large amount of heterogeneous material surrounding the lumen. Findings are concerning for dilatation of the  carotid bulb and a large amount of mural thrombus. Carotid bulb roughly measures up to 2.3 cm and previously measured roughly 1.6 cm. External carotid artery is patent with normal waveform. Irregular plaque and mural thrombus in the proximal internal carotid artery. Normal waveforms and velocities in the internal carotid artery. RIGHT VERTEBRAL ARTERY: Antegrade flow and normal waveform in the right vertebral artery. LEFT CAROTID ARTERY: Again noted is mild ectasia of the left carotid bulb with peripheral plaque. Plaque in the left carotid bulb region is similar to the previous examination. Small amount of plaque at the origin of the external carotid artery. External carotid artery is patent with normal waveform. Small amount of plaque in the proximal internal carotid artery. Normal waveforms and velocities in the internal carotid artery. LEFT VERTEBRAL ARTERY: Antegrade flow and normal waveform in the left vertebral artery. 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 The right carotid bulb is enlarged measuring up to 2.3 cm. 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 This represents progression of disease from the previous examination. This large amount of mural thrombus and plaque appears to be high risk for embolic disease. Recommend vascular surgery consultation. Estimated degree of stenosis in the internal carotid arteries is less than 50%. Ectasia and plaque at the left carotid bulb is similar to the prior examination. Patent vertebral arteries with antegrade flow. These results will be called to the ordering clinician or representative by the Radiologist Assistant, and communication documented in the PACS or zVision Dashboard. Electronically Signed   By: Markus Daft M.D.   On: 08/03/2017 09:52   Mr Jodene Nam Head/brain LP Cm  Result Date: 08/03/2017 CLINICAL DATA:  Weakness, dizziness and blurred vision over the last  day. EXAM: MRI HEAD WITHOUT CONTRAST MRA HEAD WITHOUT CONTRAST TECHNIQUE: Multiplanar, multiecho pulse sequences of the brain and surrounding structures were obtained without intravenous contrast. Angiographic images of the head were obtained using MRA technique without contrast. COMPARISON:  Head CT 08/02/2017.  MRI 11/10/2009. FINDINGS: MRI HEAD FINDINGS Brain: Diffusion imaging shows a 4-5 mm acute infarction within the medial right parietal lobe. No other acute infarction. There chronic small-vessel ischemic changes of the pons. No focal cerebellar insult. There are old small vessel infarctions affecting the thalami, basal ganglia and throughout the cerebral hemispheric deep and subcortical white matter. No large vessel  territory infarction. No mass lesion, hemorrhage, hydrocephalus or extra-axial collection. The findings are progressive since the study of 2011. Vascular: Major vessels at the base of the brain show flow. Skull and upper cervical spine: Negative Sinuses/Orbits: Clear/normal Other: None MRA HEAD FINDINGS Both internal carotid arteries are patent through the skull base and siphon regions. Some artifact in the supraclinoid region prevents accurate evaluation of that segment. The anterior and middle cerebral vessels are patent without proximal stenosis, aneurysm or vascular malformation. Both vertebral arteries are patent to the basilar. No basilar stenosis. Again there is some artifact in the midportion. Superior cerebellar and posterior cerebral arteries are patent. IMPRESSION: 4-5 mm acute infarction within the medial right parietal lobe. Advanced chronic small-vessel ischemic changes elsewhere throughout the brain, with particular involvement of the thalami and basal ganglia. Findings are progressive since 2011. Motion degraded intracranial MR angiography does not suggest any large or medium vessel occlusion or correctable stenosis. See above. Electronically Signed   By: Nelson Chimes M.D.   On:  08/03/2017 08:03    Microbiology: Recent Results (from the past 240 hour(s))  Culture, Urine     Status: Abnormal   Collection Time: 08/03/17  5:57 AM  Result Value Ref Range Status   Specimen Description   Final    URINE, CLEAN CATCH Performed at [facility], [address], [place], [place] [address]    Special Requests   Final    NONE Performed at [facility], [address], [place], [place] [address]    Culture >=100,000 COLONIES/mL KLEBSIELLA PNEUMONIAE (A)  Final   Report Status 08/05/2017 FINAL  Final   Organism ID, Bacteria KLEBSIELLA PNEUMONIAE (A)  Final      Susceptibility   Klebsiella pneumoniae - MIC*    AMPICILLIN >=32 RESISTANT Resistant     CEFAZOLIN <=4 SENSITIVE Sensitive     CEFTRIAXONE <=1 SENSITIVE Sensitive     CIPROFLOXACIN <=0.25 SENSITIVE Sensitive     GENTAMICIN <=1 SENSITIVE Sensitive     IMIPENEM <=0.25 SENSITIVE Sensitive     NITROFURANTOIN 64 INTERMEDIATE Intermediate     TRIMETH/SULFA <=20 SENSITIVE Sensitive     AMPICILLIN/SULBACTAM 4 SENSITIVE Sensitive     PIP/TAZO <=4 SENSITIVE Sensitive     Extended ESBL NEGATIVE Sensitive     * >=100,000 COLONIES/mL KLEBSIELLA PNEUMONIAE     Labs: Basic Metabolic Panel: Recent Labs  Lab 08/04/17 0501 08/05/17 0556 08/06/17 0553 08/07/17 0610 08/08/17 0547  NA 137 137 139 134* 133*  K 3.6 3.6 3.7 3.6 3.7  CL 102 100* 103 99* 99*  CO2 23 27 26 25 24   GLUCOSE 100* 108* 94 93 88  BUN 18 16 18  21* 22*  CREATININE 1.85* 1.71* 1.75* 1.80* 1.80*  CALCIUM 8.9 9.2 9.6 9.3 9.5   Liver Function Tests: Recent Labs  Lab 08/02/17 1804  AST 16  ALT 8*  ALKPHOS 61  BILITOT 0.6  PROT 8.0  ALBUMIN 3.6   No results for input(s): LIPASE, AMYLASE in the last 168 hours. No results for input(s): AMMONIA in the last 168 hours. CBC: Recent Labs  Lab 08/02/17 1804 08/03/17 1038 08/04/17 0501 08/05/17 0556 08/08/17 0547  WBC 7.6 7.6 8.0 9.0 8.9  NEUTROABS 4.0  --   --   --   --   HGB 12.8* 11.4*  12.3* 11.7* 12.5*  HCT 40.5 36.1* 38.8* 36.9* 39.6  MCV 86.0 86.0 85.7 85.0 85.3  PLT 145* 124* 141* 145* 159   Cardiac Enzymes: Recent Labs  Lab  08/03/17 0549  TROPONINI <0.03   BNP: BNP (last 3 results) No results for input(s): BNP in the last 8760 hours.  ProBNP (last 3 results) No results for input(s): PROBNP in the last 8760 hours.  CBG: No results for input(s): GLUCAP in the last 168 hours.     Signed:  Irine Seal MD.  [facility] 08/08/2017, 4:33 PM

## 2017-08-31 NOTE — Progress Notes (Signed)
New Carotid Patient  Requested by:  [name], MD [address] [place], [place] [address]  Reason for consultation: right carotid abnormality   History of Present Illness   Jeffrey Carney is a 62 y.o. ([birth year]) male who presents with chief complaint: stroke.  Wife notes a longstanding history of "mini strokes" where the patient feels dizzy.  Patient recalls two weeks ago having an episode of difficulty speaking, dizziness and weakness in his hands.  He was admitted to the hospital and neurology saw the patient.  They felt his R parietal CVA might be related to his R carotid findings on ultrasound  Previous carotid studies demonstrated: RICA <19% stenosis, LICA <14% stenosis.  Possible significant thrombus was visualized in the R carotid bulb.  The patient has never had amaurosis fugax or monocular blindness, rather he has had "blurry vision."  The patient has had previous episode of facial drooping and weakness with walking.  Patient and wife are uncertain which side.  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   The patient's previous neurologic deficits have resolved.    The patient's risks factors for carotid disease include: poorly controlled HTN, hyperglycemia and active smoker.  Past Medical History:  Diagnosis Date  . Hypertension   . Stroke [facility])    TIA    Past Surgical History: none  Social History   Socioeconomic History  . Marital status: Married    Spouse name: Not on file  . Number of children: Not on file  . Years of education: Not on file  . Highest education level: Not on file  Occupational History  . Not on file  Social Needs  . Financial resource strain: Not on file  . Food insecurity:    Worry: Not on file    Inability: Not on file  . Transportation needs:    Medical: Not on file    Non-medical: Not on file  Tobacco Use  . Smoking status: Current Every Day Smoker    Types: Cigarettes  . Smokeless tobacco: Never Used    Substance and Sexual Activity  . Alcohol use: Yes    Comment: "sometimes"  . Drug use: Yes    Types: Marijuana    Comment: 2 days ago  . Sexual activity: Not on file  Lifestyle  . Physical activity:    Days per week: Not on file    Minutes per session: Not on file  . Stress: Not on file  Relationships  . Social connections:    Talks on phone: Not on file    Gets together: Not on file    Attends religious service: Not on file    Active member of club or organization: Not on file    Attends meetings of clubs or organizations: Not on file    Relationship status: Not on file  . Intimate partner violence:    Fear of current or ex partner: Not on file    Emotionally abused: Not on file    Physically abused: Not on file    Forced sexual activity: Not on file  Other Topics Concern  . Not on file  Social History Narrative  . Not on file    Family History  Problem Relation Age of Onset  . Hypertension Mother   . Hypertension Father   . Cancer Brother     Current Outpatient Medications  Medication Sig Dispense Refill  . amLODipine (NORVASC) 10 MG tablet Take 1 tablet (10 mg total)  by mouth daily. 30 tablet 0  . aspirin 325 MG tablet Take 1 tablet (325 mg total) by mouth daily.    Marland Kitchen atorvastatin (LIPITOR) 80 MG tablet Take 1 tablet (80 mg total) by mouth daily at 6 PM. 30 tablet 0  . carvedilol (COREG) 3.125 MG tablet Take 1 tablet (3.125 mg total) by mouth 2 (two) times daily with a meal. 60 tablet 0  . cloNIDine (CATAPRES) 0.1 MG tablet Take 1 tablet (0.1 mg total) by mouth 2 (two) times daily. 60 tablet 0  . clopidogrel (PLAVIX) 75 MG tablet Take 1 tablet (75 mg total) by mouth daily. 30 tablet 1  . hydrALAZINE (APRESOLINE) 100 MG tablet Take 1 tablet (100 mg total) by mouth 3 (three) times daily. 90 tablet 0  . isosorbide mononitrate (IMDUR) 60 MG 24 hr tablet Take 1 tablet (60 mg total) by mouth daily. 30 tablet 0   No current facility-administered medications for this  visit.     No Known Allergies  REVIEW OF SYSTEMS (negative unless checked):   Cardiac:  []  Chest pain or chest pressure? [x]  Shortness of breath upon activity? []  Shortness of breath when lying flat? []  Irregular heart rhythm?  Vascular:  []  Pain in calf, thigh, or hip brought on by walking? []  Pain in feet at night that wakes you up from your sleep? []  Blood clot in your veins? []  Leg swelling?  Pulmonary:  []  Oxygen at home? []  Productive cough? []  Wheezing?  Neurologic:  []  Sudden weakness in arms or legs? []  Sudden numbness in arms or legs? [x]  Sudden onset of difficult speaking or slurred speech? []  Temporary loss of vision in one eye? []  Problems with dizziness?  Gastrointestinal:  []  Blood in stool? []  Vomited blood?  Genitourinary:  []  Burning when urinating? []  Blood in urine?  Psychiatric:  []  Major depression  Hematologic:  []  Bleeding problems? []  Problems with blood clotting?  Dermatologic:  []  Rashes or ulcers?  Constitutional:  []  Fever or chills?  Ear/Nose/Throat:  []  Change in hearing? []  Nose bleeds? []  Sore throat?  Musculoskeletal:  []  Back pain? []  Joint pain? []  Muscle pain?   For VQI Use Only   PRE-ADM LIVING Home  AMB STATUS Ambulatory  CAD Sx None  PRIOR CHF None  STRESS TEST No    Physical Examination     Vitals:   09/06/17 0947 09/06/17 0953 09/06/17 0956  BP: (!) 143/93 (!) 135/92 (!) 153/97  Pulse: 70 66 66  Resp: 18    Temp: (!) 96.9 F (36.1 C)    TempSrc: Oral    SpO2: 100%    Weight: 166 lb (75.3 kg)    Height: 5\' 11"  (1.803 m)     Body mass index is 23.15 kg/m.  General Alert, O x 3, WD, appears older than stated age  [place]/AT,    Ear/Nose/ Throat Hearing grossly intact, nares without erythema or drainage, oropharynx without Erythema or Exudate, Mallampati score: 3, poor dentiion  Eyes PERRLA, EOMI, arcus senilus  Neck Supple, mid-line trachea,    Pulmonary Sym exp, good B air movt, CTA  B  Cardiac RRR, Nl S1, S2, no Murmurs, No rubs, No S3,S4  Vascular Vessel Right Left  Radial Palpable Palpable  Brachial Palpable Palpable  Carotid Palpable, No Bruit Palpable, No Bruit  Aorta Not palpable N/A  Femoral Palpable Palpable  Popliteal Not palpable Not palpable  PT Faintly palpable Faintly palpable  DP Faintly palpable Faintly palpable  Gastro- intestinal soft, non-distended, non-tender to palpation, No guarding or rebound, no HSM, no masses, no CVAT B, No palpable prominent aortic pulse,    Musculo- skeletal M/S 5/5 throughout with mildly asx hands R>L, Extremities without ischemic changes  , No edema present, No visible varicosities , No Lipodermatosclerosis present  Neurologic Cranial nerves 2-12 intact , Pain and light touch intact in extremities , Motor exam as listed above  Psychiatric Judgement intact, Mood & affect appropriate for pt's clinical situation  Dermatologic See M/S exam for extremity exam, No rashes otherwise noted  Lymphatic  Palpable lymph nodes: None     Non-invasive Vascular Imaging   Outside B Carotid Duplex (08/03/17):  Markedly abnormal appearance of the right carotid bulb. The right carotid bulb is enlarged measuring up to 2.3 cm. 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 This represents progression of disease from the previous examination. This large amount of mural thrombus and plaque appears to be high risk for embolic disease. Recommend vascular surgery consultation.   Estimated degree of stenosis in the internal carotid arteries is less than 50%.   Ectasia and plaque at the left carotid bulb is similar to the prior examination.   Patent vertebral arteries with antegrade flow.   Radiology     MRI Head (08/03/17):  4-5 mm acute infarction within the medial right parietal lobe. Advanced chronic small-vessel ischemic changes elsewhere throughout the  brain, with particular involvement of the thalami and basal ganglia. Findings are progressive since 2011.   Motion degraded intracranial MR angiography does not suggest any large or medium vessel occlusion or correctable stenosis. See above.   Outside Studies/Documentation   10 pages of outside documents were reviewed including: outside ED and hospital reports.   Medical Decision Making   Pinchas Reither is a 62 y.o. 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  The patient appears to have either an ectatic vs aneurysm carotid artery.  The segment is likely surgically accessible. Based on the patient's vascular studies and examination, I would obtain: repeat formal B carotid duplex, BLE GSV mapping, and Cardiology preop risk stratification and optimization. Laterality of sx don't correspond to the carotid lesion or his CVA on MRI, so there may be a secondary etiology causing a TIA. Dizziness frequently is a non-carotid etiology, so further work-up with Cardiology preop is recommended. Optimization of the BP control is necessary to try to avoid hyperperfusion syndrome post-intervention. I discussed in depth with the patient the nature of atherosclerosis, and emphasized the importance of maximal medical management including strict control of blood pressure, blood glucose, and lipid levels, obtaining regular exercise, antiplatelet agents, and cessation of smoking.   The patient is currently on a statin: Lipitor.  The patient is currently on an anti-platelet: ASA.  The patient is aware that without maximal medical management the underlying atherosclerotic disease process will progress, limiting the benefit of any interventions.  Once all the studies are completed and Cardiac evaluation is complete, will have patient come back in the next 2-4 weeks.  Thank you for allowing [place] to participate in  this patient's care.   Adele Barthel, MD, FACS [facility] Office: [phone] Pager: [phone]  08/31/2017, 10:13 AM

## 2017-09-06 ENCOUNTER — Encounter: Payer: Self-pay | Admitting: Vascular Surgery

## 2017-09-06 ENCOUNTER — Other Ambulatory Visit: Payer: Self-pay

## 2017-09-06 ENCOUNTER — Ambulatory Visit (INDEPENDENT_AMBULATORY_CARE_PROVIDER_SITE_OTHER): Payer: Medicaid Other | Admitting: Vascular Surgery

## 2017-09-06 VITALS — BP 153/97 | HR 66 | Temp 96.9°F | Resp 18 | Ht 71.0 in | Wt 166.0 lb

## 2017-09-06 DIAGNOSIS — I639 Cerebral infarction, unspecified: Secondary | ICD-10-CM

## 2017-09-06 DIAGNOSIS — I739 Peripheral vascular disease, unspecified: Secondary | ICD-10-CM

## 2017-09-06 DIAGNOSIS — I779 Disorder of arteries and arterioles, unspecified: Secondary | ICD-10-CM | POA: Insufficient documentation

## 2017-09-06 DIAGNOSIS — I6523 Occlusion and stenosis of bilateral carotid arteries: Secondary | ICD-10-CM

## 2017-09-12 ENCOUNTER — Telehealth: Payer: Self-pay | Admitting: Family Medicine

## 2017-09-12 ENCOUNTER — Ambulatory Visit: Payer: Self-pay | Admitting: Family Medicine

## 2017-09-12 NOTE — Telephone Encounter (Signed)
Patients wife said patient was in the in hospital and she was not sure if his appt upcoming was urgent or not. I let her know he was on the cancellation list and he would be the first contact if there is to be a cancellation.

## 2017-09-13 ENCOUNTER — Other Ambulatory Visit: Payer: Self-pay

## 2017-09-13 DIAGNOSIS — I6523 Occlusion and stenosis of bilateral carotid arteries: Secondary | ICD-10-CM

## 2017-10-03 ENCOUNTER — Encounter (HOSPITAL_COMMUNITY): Payer: Medicaid Other

## 2017-10-04 ENCOUNTER — Encounter (HOSPITAL_COMMUNITY): Payer: Medicaid Other

## 2017-10-04 ENCOUNTER — Ambulatory Visit: Payer: Medicaid Other | Admitting: Cardiology

## 2017-10-04 NOTE — Progress Notes (Deleted)
Clinical Summary Jeffrey Carney is a 62 y.o.male seen as new consult  1. Carotid aneurysm/Preoperative evaluation - recent CVA - being considreed for surgery for carotid aneurysm and stroke  2. Dizziness?    Past Medical History:  Diagnosis Date  . Hypertension   . Stroke [facility] [employer])    TIA     No Known Allergies   Current Outpatient Medications  Medication Sig Dispense Refill  . amLODipine (NORVASC) 10 MG tablet Take 1 tablet (10 mg total) by mouth daily. 30 tablet 0  . aspirin 325 MG tablet Take 1 tablet (325 mg total) by mouth daily.    Marland Kitchen atorvastatin (LIPITOR) 80 MG tablet Take 1 tablet (80 mg total) by mouth daily at 6 PM. 30 tablet 0  . carvedilol (COREG) 3.125 MG tablet Take 1 tablet (3.125 mg total) by mouth 2 (two) times daily with a meal. 60 tablet 0  . cloNIDine (CATAPRES) 0.1 MG tablet Take 1 tablet (0.1 mg total) by mouth 2 (two) times daily. 60 tablet 0  . clopidogrel (PLAVIX) 75 MG tablet Take 1 tablet (75 mg total) by mouth daily. 30 tablet 1  . hydrALAZINE (APRESOLINE) 100 MG tablet Take 1 tablet (100 mg total) by mouth 3 (three) times daily. 90 tablet 0  . isosorbide mononitrate (IMDUR) 60 MG 24 hr tablet Take 1 tablet (60 mg total) by mouth daily. 30 tablet 0   No current facility-administered medications for this visit.      No past surgical history on file.   No Known Allergies    Family History  Problem Relation Age of Onset  . Hypertension Mother   . Hypertension Father   . Cancer Brother      Social History Jeffrey Carney reports that he has been smoking cigarettes.  He has never used smokeless tobacco. Jeffrey Carney reports that he drinks alcohol.   Review of Systems CONSTITUTIONAL: No weight loss, fever, chills, weakness or fatigue.  HEENT: Eyes: No visual loss, blurred vision, double vision or yellow sclerae.No hearing loss, sneezing, congestion, runny nose or sore throat.  SKIN: No rash or itching.  CARDIOVASCULAR:  RESPIRATORY: No shortness  of breath, cough or sputum.  GASTROINTESTINAL: No anorexia, nausea, vomiting or diarrhea. No abdominal pain or blood.  GENITOURINARY: No burning on urination, no polyuria NEUROLOGICAL: No headache, dizziness, syncope, paralysis, ataxia, numbness or tingling in the extremities. No change in bowel or bladder control.  MUSCULOSKELETAL: No muscle, back pain, joint pain or stiffness.  LYMPHATICS: No enlarged nodes. No history of splenectomy.  PSYCHIATRIC: No history of depression or anxiety.  ENDOCRINOLOGIC: No reports of sweating, cold or heat intolerance. No polyuria or polydipsia.  Marland Kitchen   Physical Examination There were no vitals filed for this visit. There were no vitals filed for this visit.  Gen: resting comfortably, no acute distress HEENT: no scleral icterus, pupils equal round and reactive, no palptable cervical adenopathy,  CV Resp: Clear to auscultation bilaterally GI: abdomen is soft, non-tender, non-distended, normal bowel sounds, no hepatosplenomegaly MSK: extremities are warm, no edema.  Skin: warm, no rash Neuro:  no focal deficits Psych: appropriate affect   Diagnostic Studies  07/2017 echo Study Conclusions  - Left ventricle: The cavity size was normal. Wall thickness was   increased in a pattern of moderate LVH. Systolic function was   normal. The estimated ejection fraction was in the range of 60%   to 65%. Wall motion was normal; there were no regional wall   motion abnormalities.  Doppler parameters are consistent with   abnormal left ventricular relaxation (grade 1 diastolic   dysfunction). - Mitral valve: There was trivial regurgitation. - Right atrium: The atrium was at the upper limits of normal in   size. Central venous pressure (est): 3 mm Hg. - Atrial septum: No defect or patent foramen ovale was identified. - Tricuspid valve: There was trivial regurgitation. - Pulmonary arteries: Systolic pressure could not be accurately   estimated. - Pericardium,  extracardiac: There was no pericardial effusion.   Assessment and Plan        Jeffrey Carney, M.D., F.A.C.C.

## 2017-10-05 ENCOUNTER — Encounter: Payer: Self-pay | Admitting: Cardiology

## 2017-10-10 NOTE — Progress Notes (Signed)
Established Carotid Patient   History of Present Illness   Jeffrey Carney is a 62 y.o. (1955-08-03) male RHD who presents with chief complaint: some minimal expressive aphasia and gait instability.  Patient has a right carotid ectasia that was felt to be the etiology of recent R parietal CVA.  The patient returns today for repeat R carotid duplex and BLE GSV mapping in case an interposition repaired is needed.  The patient has had no additional CVA or TIA sx.  Prior neuro deficits were expressive aphasia, dizziness and weakness in his hands.  Cardiology preop work-up appointment was missed.  The patient's PMH, PSH, SH, and FamHx were reviewed on 10/11/17 are unchanged from 09/06/17.  Current Outpatient Medications  Medication Sig Dispense Refill  . amLODipine (NORVASC) 10 MG tablet Take 1 tablet (10 mg total) by mouth daily. 30 tablet 0  . aspirin 325 MG tablet Take 1 tablet (325 mg total) by mouth daily.    Marland Kitchen atorvastatin (LIPITOR) 80 MG tablet Take 1 tablet (80 mg total) by mouth daily at 6 PM. 30 tablet 0  . carvedilol (COREG) 3.125 MG tablet Take 1 tablet (3.125 mg total) by mouth 2 (two) times daily with a meal. 60 tablet 0  . cloNIDine (CATAPRES) 0.1 MG tablet Take 1 tablet (0.1 mg total) by mouth 2 (two) times daily. 60 tablet 0  . clopidogrel (PLAVIX) 75 MG tablet Take 1 tablet (75 mg total) by mouth daily. 30 tablet 1  . hydrALAZINE (APRESOLINE) 100 MG tablet Take 1 tablet (100 mg total) by mouth 3 (three) times daily. 90 tablet 0  . isosorbide mononitrate (IMDUR) 60 MG 24 hr tablet Take 1 tablet (60 mg total) by mouth daily. 30 tablet 0   No current facility-administered medications for this visit.     On ROS today: continued gait instability, some difficulty with word finding and fluency of speech   Physical Examination   Vitals:   10/11/17 1116  BP: (!) 143/103  Pulse: 73  Resp: 18  Temp: 97.8 F (36.6 C)  TempSrc: Oral  SpO2: 100%  Weight: 156 lb (70.8 kg)  Height:  5\' 10"  (1.778 m)   Body mass index is 22.38 kg/m.  General Alert, O x 3, WD, NAD  Neck Supple, mid-line trachea,    Pulmonary Sym exp, good B air movt, CTA B  Cardiac RRR, Nl S1, S2, no Murmurs, No rubs, No S3,S4  Vascular Vessel Right Left  Radial Palpable Palpable  Brachial Palpable Palpable  Carotid Palpable, No Bruit Palpable, No Bruit  Aorta Readily palpable pulse in midline N/A  Femoral Palpable Palpable  Popliteal Not palpable Not palpable  PT Not palpable Not palpable  DP Faintly palpable Faintly palpable    Gastro- intestinal soft, non-distended, non-tender to palpation, No guarding or rebound, no HSM, no masses, no CVAT B, No palpable prominent aortic pulse,    Musculo- skeletal M/S 5/5 throughout  , Extremities without ischemic changes    Neurologic Cranial nerves 2-12 intact , Pain and light touch intact in extremities , Motor exam as listed above    Non-Invasive Vascular Imaging   B Carotid Duplex (10/11/2017):   R:  R ICA stenosis:  1-39%  R VA: patent and antegrade  Diameters: 0.87-2.5 cm  L:  L ICA stenosis:  1-39%  L VA: patent and antegrade  Diameters: 0.88-1.70 cm, smooth thrombus  BLE GSV Mapping (10/11/2017)  R: viable throughout  SSV thrombosed  L: Viable down to knee  SSV thrmbosed   Medical Decision Making   Jeffrey Carney is a 62 y.o. 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    Based on the patient's vascular studies and examination, I have offered the patient: Head CTA.  Additionally, I would get AAA duplex on follow up to evaluate for AAA.  Unclear if prominent pulse in mid-line is due to diastasis recti vs AAA.  I discussed in depth with the patient the nature of atherosclerosis, and emphasized the importance of maximal medical management including strict control of  blood pressure, blood glucose, and lipid levels, antiplatelet agents, obtaining regular exercise, and cessation of smoking.    The patient is aware that without maximal medical management the underlying atherosclerotic disease process will progress, limiting the benefit of any interventions.  The patient is currently on a statin: Lipitor.   The patient is currently on an anti-platelet: ASA and Plavix.  Thank you for allowing [place] to participate in this patient's care.   Adele Barthel, MD, FACS [facility] Office: [phone] Pager: [phone]

## 2017-10-11 ENCOUNTER — Ambulatory Visit (HOSPITAL_COMMUNITY)
Admission: RE | Admit: 2017-10-11 | Discharge: 2017-10-11 | Disposition: A | Discharge status: Home / Self Care | Payer: Medicaid Other | Source: Ambulatory Visit | Attending: Vascular Surgery | Admitting: Vascular Surgery

## 2017-10-11 ENCOUNTER — Ambulatory Visit (INDEPENDENT_AMBULATORY_CARE_PROVIDER_SITE_OTHER): Payer: Medicaid Other | Admitting: Vascular Surgery

## 2017-10-11 ENCOUNTER — Encounter: Payer: Self-pay | Admitting: Vascular Surgery

## 2017-10-11 VITALS — BP 143/103 | HR 73 | Temp 97.8°F | Resp 18 | Ht 70.0 in | Wt 156.0 lb

## 2017-10-11 DIAGNOSIS — I639 Cerebral infarction, unspecified: Secondary | ICD-10-CM

## 2017-10-11 DIAGNOSIS — I6523 Occlusion and stenosis of bilateral carotid arteries: Secondary | ICD-10-CM

## 2017-10-12 ENCOUNTER — Other Ambulatory Visit: Payer: Self-pay

## 2017-10-12 DIAGNOSIS — I639 Cerebral infarction, unspecified: Secondary | ICD-10-CM

## 2017-10-12 DIAGNOSIS — I6523 Occlusion and stenosis of bilateral carotid arteries: Secondary | ICD-10-CM

## 2017-10-23 ENCOUNTER — Telehealth: Payer: Self-pay | Admitting: Family Medicine

## 2017-10-23 NOTE — Telephone Encounter (Signed)
He has never been seen in our practice before. I cannot call in medications for him until he is seen in our office. See if there is a sooner spot.

## 2017-10-23 NOTE — Telephone Encounter (Signed)
Patient is scheduled to come in for new patient appt on 10/31/17. Advised we can not call in refills for medications when we haven't seen him.

## 2017-10-23 NOTE — Telephone Encounter (Signed)
Pt is calling in needing prescription refills, as he had an appt first of 4-2 but that had to be reschd due to dr being sick, he is on the sch for May and will be coming in.   please advise if you can send in meds.

## 2017-10-31 ENCOUNTER — Other Ambulatory Visit: Payer: Self-pay

## 2017-10-31 ENCOUNTER — Ambulatory Visit (INDEPENDENT_AMBULATORY_CARE_PROVIDER_SITE_OTHER): Payer: Medicaid Other | Admitting: Family Medicine

## 2017-10-31 ENCOUNTER — Encounter: Payer: Self-pay | Admitting: Family Medicine

## 2017-10-31 VITALS — BP 150/100 | HR 72 | Temp 98.6°F | Resp 12 | Ht 70.0 in | Wt 163.0 lb

## 2017-10-31 DIAGNOSIS — I639 Cerebral infarction, unspecified: Secondary | ICD-10-CM

## 2017-10-31 DIAGNOSIS — Z87898 Personal history of other specified conditions: Secondary | ICD-10-CM | POA: Diagnosis not present

## 2017-10-31 DIAGNOSIS — F1290 Cannabis use, unspecified, uncomplicated: Secondary | ICD-10-CM

## 2017-10-31 DIAGNOSIS — Z720 Tobacco use: Secondary | ICD-10-CM

## 2017-10-31 DIAGNOSIS — I714 Abdominal aortic aneurysm, without rupture, unspecified: Secondary | ICD-10-CM

## 2017-10-31 DIAGNOSIS — F321 Major depressive disorder, single episode, moderate: Secondary | ICD-10-CM | POA: Diagnosis not present

## 2017-10-31 DIAGNOSIS — I10 Essential (primary) hypertension: Secondary | ICD-10-CM

## 2017-10-31 DIAGNOSIS — F1011 Alcohol abuse, in remission: Secondary | ICD-10-CM

## 2017-10-31 DIAGNOSIS — Z23 Encounter for immunization: Secondary | ICD-10-CM

## 2017-10-31 MED ORDER — BUPROPION HCL ER (XL) 150 MG PO TB24: 150.0000 mg | ORAL_TABLET | Freq: Every day | ORAL | 0 refills | Status: AC

## 2017-10-31 NOTE — Progress Notes (Signed)
Patient ID: Jeffrey Carney, male    DOB: 03-11-1956, 62 y.o.   MRN: [id number]  Chief Complaint  Patient presents with  . Establish Care  . Hypertension    Allergies Patient has no known allergies.  Subjective:   Jeffrey Carney is a 62 y.o. male who presents to [facility] today.  HPI Jeffrey Carney presents today as a new patient visit to establish care.  He is accompanied by his ex-wife for the visit today.  He is evidently living with her at this time and she is assisting in his care.  Despite being divorced they have remained friends and she reports that she  still cares for him.  He comes in today to establish care.  He has a history of peripheral vascular disease and is currently being seen by vascular surgery.  He is trying to make some changes in his lifestyle that will aid in his longevity.  He has a past history significant for tobacco and alcohol abuse.  He reports he started smoking at the age of 61 and has continued to smoke 1/2 to 1 pack/day for over 50 years.  He does not drink alcohol any longer but has a history of heavy alcohol use in the past for many years.  He does reportedly still smoke marijuana.  He denies any other illicit drug use.  He comes in today and reports that he would be interested in stopping smoking.  He has never really tried to quit before.  He understands that he needs to do this for his health.  He does report that he is felt down and depressed a good bit of the time lately.  His ex-wife reports that he lays in bed all the time and just watches TV.  She reports he is not very interactive.  She reports that he has some emotional stressors related to his 2 daughters from a previous relationship.  He does not wish to talk about this but reports that it does cause him some stress.  He denies any suicidal or homicidal ideations.  He would be interested in taking a medication for his mood.  He has no history of seizure disorder.  He has had no seizures in the  past.  He reports that his blood pressure has been uncontrolled for many years.  He did not take his blood pressure medications this morning.  He reports he was rushing to get here and so he did not take any of his medications.  He has had a stroke in the past.  He is taking aspirin, Plavix, and Lipitor secondary to his history of CVA.  He does not wish to talk to a therapist at this time.  He has been considering going to church for many years but has been only once in the past 20 years.  His ex-wife reports that she is willing to help him make improvements in his quality of life and try to improve his health status.   Past Medical History:  Diagnosis Date  . Hypertension   . Stroke [facility])    TIA    History reviewed. No pertinent surgical history.  Family History  Problem Relation Age of Onset  . Hypertension Mother   . Hypertension Father   . Cancer Brother   . Hypertension Brother   . Hypertension Brother   . Hypertension Brother   . Hypertension Brother   . Hypertension Brother   . Hypertension Brother   . Hypertension Sister   .  Hypertension Sister      Social History   Socioeconomic History  . Marital status: Divorced    Spouse name: Not on file  . Number of children: Not on file  . Years of education: Not on file  . Highest education level: Not on file  Occupational History  . Not on file  Social Needs  . Financial resource strain: Not very hard  . Food insecurity:    Worry: Never true    Inability: Never true  . Transportation needs:    Medical: Yes    Non-medical: Yes  Tobacco Use  . Smoking status: Current Every Day Smoker    Packs/day: 0.25    Types: Cigarettes  . Smokeless tobacco: Never Used  Substance and Sexual Activity  . Alcohol use: Not Currently    Comment: "sometimes" years last drink  . Drug use: Yes    Types: Marijuana    Comment: 2 days ago  . Sexual activity: Not Currently  Lifestyle  . Physical activity:    Days per week: 0 days     Minutes per session: 0 min  . Stress: Not at all  Relationships  . Social connections:    Talks on phone: Never    Gets together: Never    Attends religious service: Never    Active member of club or organization: No    Attends meetings of clubs or organizations: Never    Relationship status: Divorced  Other Topics Concern  . Not on file  Social History Narrative   Grew up in [place], [place].   Divorced.    2 children.   Has three grandchildren.   Enjoys fishing.    Eats all food groups.   Wear seatbelt.   Drives.    Smokes 1/2ppd since age 51 years old.   Used to drink heavy, does not drink any alcohol.   Smokes marijuana.    Ex-Wife is Ernestine, accompany him to visit.    Current Outpatient Medications on File Prior to Visit  Medication Sig Dispense Refill  . amLODipine (NORVASC) 10 MG tablet Take 1 tablet (10 mg total) by mouth daily. 30 tablet 0  . aspirin 325 MG tablet Take 1 tablet (325 mg total) by mouth daily.    Marland Kitchen atorvastatin (LIPITOR) 80 MG tablet Take 1 tablet (80 mg total) by mouth daily at 6 PM. 30 tablet 0  . carvedilol (COREG) 3.125 MG tablet Take 1 tablet (3.125 mg total) by mouth 2 (two) times daily with a meal. 60 tablet 0  . cloNIDine (CATAPRES) 0.2 MG tablet Take 0.2 mg by mouth 2 (two) times daily.  1  . clopidogrel (PLAVIX) 75 MG tablet Take 1 tablet (75 mg total) by mouth daily. 30 tablet 1  . hydrALAZINE (APRESOLINE) 100 MG tablet Take 1 tablet (100 mg total) by mouth 3 (three) times daily. 90 tablet 0  . isosorbide mononitrate (IMDUR) 60 MG 24 hr tablet Take 1 tablet (60 mg total) by mouth daily. (Patient not taking: Reported on 10/11/2017) 30 tablet 0   No current facility-administered medications on file prior to visit.     Review of Systems  Constitutional: Negative for appetite change, chills, fever and unexpected weight change.  HENT: Negative for trouble swallowing and voice change.   Eyes: Negative for visual disturbance.  Respiratory:  Negative for cough, chest tightness, shortness of breath and wheezing.   Cardiovascular: Negative for chest pain, palpitations and leg swelling.  Gastrointestinal: Negative for abdominal pain, diarrhea, nausea and  vomiting.  Genitourinary: Negative for decreased urine volume, dysuria and frequency.  Skin: Negative for rash.  Neurological: Negative for dizziness, tremors, syncope, facial asymmetry, weakness and headaches.  Hematological: Negative for adenopathy. Does not bruise/bleed easily.  Psychiatric/Behavioral: Positive for decreased concentration and dysphoric mood. Negative for self-injury, sleep disturbance and suicidal ideas. The patient is not nervous/anxious.      Objective:   BP (!) 150/100 (BP Location: Left Arm, Patient Position: Sitting, Cuff Size: Normal)   Pulse 72   Temp 98.6 F (37 C) (Temporal)   Resp 12   Ht 5\' 10"  (1.778 m)   Wt 163 lb 0.6 oz (74 kg)   SpO2 99%   BMI 23.39 kg/m   Physical Exam  Constitutional: He is oriented to person, place, and time. He appears well-developed and well-nourished.  Alert and oriented.  No acute distress.  Pleasant male.  Appearance older than his stated age.  Very poor dentition.  HENT:  Head: Normocephalic and atraumatic.  Mouth/Throat: No oropharyngeal exudate.  Eyes: Pupils are equal, round, and reactive to light. Conjunctivae and EOM are normal. No scleral icterus.  Neck: Normal range of motion. Neck supple.  Cardiovascular: Normal rate, regular rhythm and normal heart sounds.  Pulmonary/Chest: Effort normal and breath sounds normal. No respiratory distress.  Abdominal: Soft. Bowel sounds are normal.  Lymphadenopathy:    He has no cervical adenopathy.  Neurological: He is alert and oriented to person, place, and time. No cranial nerve deficit.  Skin: Skin is warm and dry. Capillary refill takes less than 2 seconds.  Psychiatric: His speech is normal and behavior is normal. Judgment and thought content normal. His affect  is blunt. Cognition and memory are normal. He expresses no homicidal and no suicidal ideation. He expresses no suicidal plans and no homicidal plans.  Mood somewhat dysthymic/sad but not overtly depressed.  His mood is a little bit blunted.  However, he does express sadness and become slightly tearful during our conversation.  He is slow to answer questions.  Unsure if this is related to mood versus educational level versus history of TIA.    Depression screen [facility] 2/9 10/31/2017 10/11/2017  Decreased Interest 3 0  Down, Depressed, Hopeless 2 0  PHQ - 2 Score 5 0  Altered sleeping 2 -  Tired, decreased energy 3 -  Change in appetite 0 -  Feeling bad or failure about yourself  3 -  Trouble concentrating 3 -  Moving slowly or fidgety/restless 3 -  Suicidal thoughts 0 -  PHQ-9 Score 19 -  Difficult doing work/chores Somewhat difficult -    Assessment and Plan  1. Depression, major, single episode, moderate ([place]) Patient is willing to try medication for his mood that would also help him to quit smoking.  We discussed this medication in detail today.  We discussed possible side effects and risks versus benefits of medication.  He would like to try this at this time.Patient counseled in detail regarding the risks of medication. Told to call or return to clinic if develop any worrisome signs or symptoms. Patient voiced understanding.  Suicide risks evaluated and documented in note if present or in the area below.  Patient has protective factors of family and community support.  Patient reports that family believes is behaving rationally. Patient displays problem solving skills.   Patient specifically denies suicide ideation. Patient has access/information to healthcare contacts if situation or mood changes where patient is a risk to self or others or mood becomes unstable.  During the encounter, the patient had good eye contact and firm handshake regarding safety contract and agreement to seek  help if mood worsens and not to harm self.   Patient understands the treatment plan and is in agreement. Agrees to keep follow up and call prior or return to clinic if needed.   - buPROPion (WELLBUTRIN XL) 150 MG 24 hr tablet; Take 1 tablet (150 mg total) by mouth daily.  Dispense: 30 tablet; Refill: 0  2. Immunization due Done today. - Tdap vaccine greater than or equal to 7yo IM  3. Cerebrovascular accident (CVA), unspecified mechanism ([place]) Today we did discuss cardiovascular risk for recurrent CVA.  It is recommended that he continue his aspirin, Plavix, and Lipitor.  I have asked patient and his ex-wife to have his records sent from his previous PCP.  They do believe that his stroke was ischemic in etiology.  4. Hypertension, unspecified type We did discuss his blood pressure today.  He has not taken any medications.  His blood pressure is controlled today.  I did discuss that his elevated renal function is most likely due to uncontrolled chronic hypertension.  He will follow-up in 2 weeks.  We will check his blood pressure and make changes at that time.  We discussed compliance with medication.  We also discussed that tobacco smoking does elevate blood pressure.  We discussed that his blood pressure needs to be controlled to prevent further vascular disease.  He voiced understanding  5. Tobacco abuse The 5 A's Model for treating Tobacco Use and Dependence was used today. I have identified and documented tobacco use status for this patient. I have urged the patient to quit tobacco use. At this time, the patient is willing and ready to attempt to quit. We have discussed medication options to aid in smoking cessation including nicotine replacement therapy (NRT), zyban, and chantix. We have also discussed behavioral modifications, lifestyle changes, and patient support options to aid in tobacco cessation success. Patient was congratulated on their desire to make this positive change for their  health. Patient instructed to keep their follow up to ensure success.  We will try Wellbutrin at this time.  He was unable to give me a quit date today but he will try to choose a quit date within the next week.  6. History of alcohol abuse Patient was recommended to abstain from alcohol use.  7. Uses marijuana Patient was counseled regarding the risks of drug abuse.  He understands that this is an illegal substance and that it also has other risks.  He voiced understanding.  Plan to check labs at the next visit.  He will return to clinic fasting.  He will keep scheduled appointment with vascular surgery. Patient will complete paperwork to have his records sent to our office. Recommend starting multivitamin each day. He will follow-up in 2 weeks, or sooner if needed.  He will call with any questions or concerns.  Return in about 2 weeks (around 11/14/2017). Caren Macadam, MD 10/31/2017

## 2017-10-31 NOTE — Patient Instructions (Signed)

## 2017-10-31 NOTE — Progress Notes (Signed)
Cardiology Office Note   Date:  11/02/2017   ID:  Jeffrey Carney, DOB Jan 14, 1956, MRN [id number]  PCP:  Caren Macadam, MD  Cardiologist:   Jenkins Rouge, MD   No chief complaint on file.     History of Present Illness: Jeffrey Carney is a 62 y.o. male who presents for consultation regarding right carotid stenosis. Referred by Dr Mannie Stabile.  Reviewed US done 08/03/17 noted abnormal carotid bulb 2.3 cm with layered thrombus No critical ICA stenosis however. Noted CVA/TIA 08/03/17 with weakness, dizziness and blurred vision MRI noted 4-5 mm acute infarction of medial right parietal lobe Has history of HTN.  F/U duplex 10/11/17 only indicated plaque no ICA stenosis CCA 1.7 cm with smooth thrombus   He has history of smoking and ETOH abuse. Also smokes marijuana Given Welbutrin 10/31/17 to help quit  I believe he has seen Dr Bridgett Larsson from [facility] but I have no notes  Ex-wife indicates Dr Bridgett Larsson wants to do surgery on right carotid  He denies history of CAD chest pain. Walks well does more than 5 METS   Past Medical History:  Diagnosis Date  . Hypertension   . Stroke [facility])    TIA    History reviewed. No pertinent surgical history.   Current Outpatient Medications  Medication Sig Dispense Refill  . amLODipine (NORVASC) 10 MG tablet Take 1 tablet (10 mg total) by mouth daily. 30 tablet 0  . aspirin 325 MG tablet Take 1 tablet (325 mg total) by mouth daily.    Marland Kitchen atorvastatin (LIPITOR) 80 MG tablet Take 1 tablet (80 mg total) by mouth daily at 6 PM. 30 tablet 0  . buPROPion (WELLBUTRIN XL) 150 MG 24 hr tablet Take 1 tablet (150 mg total) by mouth daily. 30 tablet 0  . carvedilol (COREG) 3.125 MG tablet Take 1 tablet (3.125 mg total) by mouth 2 (two) times daily with a meal. 60 tablet 0  . cloNIDine (CATAPRES) 0.2 MG tablet Take 0.2 mg by mouth 2 (two) times daily.  1  . clopidogrel (PLAVIX) 75 MG tablet Take 1 tablet (75 mg total) by mouth daily. 30 tablet 1  . hydrALAZINE (APRESOLINE) 100 MG tablet Take  1 tablet (100 mg total) by mouth 3 (three) times daily. 90 tablet 0  . isosorbide mononitrate (IMDUR) 60 MG 24 hr tablet Take 1 tablet (60 mg total) by mouth daily. 30 tablet 0   No current facility-administered medications for this visit.     Allergies:   Patient has no known allergies.    Social History:  The patient  reports that he has been smoking cigarettes.  He has been smoking about 1.00 pack per day. He has never used smokeless tobacco. He reports that he drank alcohol. He reports that he has current or past drug history. Drug: Marijuana.   Family History:  The patient's family history includes Cancer in his brother; Hypertension in his brother, brother, brother, brother, brother, brother, father, mother, sister, and sister.    ROS:  Please see the history of present illness.   Otherwise, review of systems are positive for none.   All other systems are reviewed and negative.    PHYSICAL EXAM: VS:  BP (!) 146/106 (BP Location: Left Arm)   Pulse 72   Ht 5\' 11"  (1.803 m)   Wt 161 lb (73 kg)   SpO2 98%   BMI 22.45 kg/m  , BMI Body mass index is 22.45 kg/m. Affect appropriate Chronically ill male  HEENT:  poor dentition  Neck supple with no adenopathy JVP normal no bruits no thyromegaly Lungs clear with no wheezing and good diaphragmatic motion Heart:  S1/S2 no murmur, no rub, gallop or click PMI normal Abdomen: benighn, BS positve, no tenderness, no AAA no bruit.  No HSM or HJR Distal pulses intact with no bruits No edema Neuro non-focal Skin warm and dry No muscular weakness    EKG:  08/03/17 SR LVH no acute changes    Recent Labs: 08/02/2017: ALT 8 08/08/2017: BUN 22; Creatinine, Ser 1.80; Hemoglobin 12.5; Platelets 159; Potassium 3.7; Sodium 133    Lipid Panel    Component Value Date/Time   CHOL 121 08/03/2017 0549   TRIG 89 08/03/2017 0549   HDL 34 (L) 08/03/2017 0549   CHOLHDL 3.6 08/03/2017 0549   VLDL 18 08/03/2017 0549   LDLCALC 69 08/03/2017 0549        Wt Readings from Last 3 Encounters:  11/02/17 161 lb (73 kg)  10/31/17 163 lb 0.6 oz (74 kg)  10/11/17 156 lb (70.8 kg)      Other studies Reviewed: Additional studies/ records that were reviewed today include: Notes from hospitalization 07/2017 CT/MRI labs Notes from primary with f/u carotid duplex .    ASSESSMENT AND PLAN:  1.  HTN: discussed better compliance with meds  2. CVA: thrombotic continue ASA/Plavix no critical ICA stenosis but ? Surgery for plaque burden Is pending A CTA of neck   3. HLD:  On statin LDL 69 at goal on 08/03/17 4. Smoking Welbutrin started 10/30/17 CXR 08/03/17 bronchitic changes NAD  5. History of ETOH abuse : has abstained  6. Preop:  Given risk factors ongoing smoking and vascular disease in carotid with need for vascular surgery Will order exercise myovue to clear for surgery   Current medicines are reviewed at length with the patient today.  The patient does not have concerns regarding medicines.  The following changes have been made:  no change  Labs/ tests ordered today include: Ex Myovue   Orders Placed This Encounter  Procedures  . NM Myocar Multi W/Spect W/Wall Motion / EF     Disposition:   FU with cardiology PRN      Signed, Jenkins Rouge, MD  11/02/2017 10:02 AM    [facility] [place], [place], [place]  [address] Phone: [phone]; Fax: [phone]

## 2017-11-02 ENCOUNTER — Ambulatory Visit (INDEPENDENT_AMBULATORY_CARE_PROVIDER_SITE_OTHER): Payer: Medicaid Other | Admitting: Cardiovascular Disease

## 2017-11-02 ENCOUNTER — Encounter: Payer: Self-pay | Admitting: Cardiovascular Disease

## 2017-11-02 VITALS — BP 146/106 | HR 72 | Ht 71.0 in | Wt 161.0 lb

## 2017-11-02 DIAGNOSIS — Z01818 Encounter for other preprocedural examination: Secondary | ICD-10-CM

## 2017-11-02 NOTE — Progress Notes (Deleted)
Established Carotid Patient   History of Present Illness   Jeffrey Carney is a 62 y.o. (03/31/1956) male RHD who presents with chief complaint: ***.  The patient had R parietal CVA that felt to be due to a right carotid ectasia.  The patient presented last visit for repeat carotid studies and vein mapping.  Those studies were conflicting with a prior study, so I felt a CTA neck was necessary.  The patient's previous sx were expressive aphasia, dizziness and weakness in his hands.  ***The patient's previous neurologic deficits have *** resolved.  Additionally patient had missed his preop Cardiology appointment.  Patient is also getting a AAA duplex as I could feel a prominent pulse last visit.  The patient's PMH, PSH, SH, and FamHx were reviewed on 11/08/17 are unchanged from 10/11/17.  Current Outpatient Medications  Medication Sig Dispense Refill  . amLODipine (NORVASC) 10 MG tablet Take 1 tablet (10 mg total) by mouth daily. 30 tablet 0  . aspirin 325 MG tablet Take 1 tablet (325 mg total) by mouth daily.    Marland Kitchen atorvastatin (LIPITOR) 80 MG tablet Take 1 tablet (80 mg total) by mouth daily at 6 PM. 30 tablet 0  . buPROPion (WELLBUTRIN XL) 150 MG 24 hr tablet Take 1 tablet (150 mg total) by mouth daily. 30 tablet 0  . carvedilol (COREG) 3.125 MG tablet Take 1 tablet (3.125 mg total) by mouth 2 (two) times daily with a meal. 60 tablet 0  . cloNIDine (CATAPRES) 0.2 MG tablet Take 0.2 mg by mouth 2 (two) times daily.  1  . clopidogrel (PLAVIX) 75 MG tablet Take 1 tablet (75 mg total) by mouth daily. 30 tablet 1  . hydrALAZINE (APRESOLINE) 100 MG tablet Take 1 tablet (100 mg total) by mouth 3 (three) times daily. 90 tablet 0  . isosorbide mononitrate (IMDUR) 60 MG 24 hr tablet Take 1 tablet (60 mg total) by mouth daily. (Patient not taking: Reported on 10/11/2017) 30 tablet 0   No current facility-administered medications for this visit.     On ROS today: ***, ***   Physical Examination    ***There were no vitals filed for this visit. ***There is no height or weight on file to calculate BMI.  General {LOC:19197::"Somulent","Alert"}, {Orientation:19197::"Confused","O x 3"}, {Weight:19197::"Obese","Cachectic","WD"}, {General state of health:19197::"Ill appearing","Elderly","NAD"}  Neck Supple, mid-line trachea, {NeckIncision:19197::"Neck incision healed"," "}  Pulmonary {Chest wall:19197::"Asx chest movement","Sym exp"}, {Air movt:19197::"Decreased *** air movt","good B air movt"}, {BS:19197::"rales on ***","rhonchi on ***","wheezing on ***","CTA B"}  Cardiac {Rhythm:19197::"Irregularly, irregular rate and rhythm","RRR, Nl S1, S2"}, {Murmur:19197::"Murmur present: ***","no Murmurs"}, {Rubs:19197::"Rub present: ***","No rubs"}, {Gallop:19197::"Gallop present: ***","No S3,S4"}  Vascular Vessel Right Left  Radial {Palpable:19197::"Not palpable","Faintly palpable","Palpable"} {Palpable:19197::"Not palpable","Faintly palpable","Palpable"}  Brachial {Palpable:19197::"Not palpable","Faintly palpable","Palpable"} {Palpable:19197::"Not palpable","Faintly palpable","Palpable"}  Carotid Palpable, {Bruit:19197::"Bruit present","No Bruit"} Palpable, {Bruit:19197::"Bruit present","No Bruit"}  Aorta Not palpable N/A  Femoral {Palpable:19197::"Not palpable","Faintly palpable","Palpable"} {Palpable:19197::"Not palpable","Faintly palpable","Palpable"}  Popliteal {Palpable:19197::"Prominently palpable","Not palpable"} {Palpable:19197::"Prominently palpable","Not palpable"}  PT {Palpable:19197::"Not palpable","Faintly palpable","Palpable"} {Palpable:19197::"Not palpable","Faintly palpable","Palpable"}  DP {Palpable:19197::"Not palpable","Faintly palpable","Palpable"} {Palpable:19197::"Not palpable","Faintly palpable","Palpable"}    Gastro- intestinal soft, {Distension:19197::"distended","non-distended"}, {TTP:19197::"TTP in *** quadrant","appropriate tenderness to palpation","non-tender to palpation"},  {Guarding:19197::"Guarding and rebound in *** quadrant","No guarding or rebound"}, {HSM:19197::"HSM present","no HSM"}, {Masses:19197::"Mass present: ***","no masses"}, {Flank:19197::"CVAT on ***","Flank bruit present on ***","no CVAT B"}, {AAA:19197::"Palpable prominent aortic pulse","No palpable prominent aortic pulse"}, {Surgical incision:19197::"Surg incisions: ***","Surgical incisions well healed"," "}  Musculo- skeletal M/S 5/5 throughout {MS:19197::"except ***"," "}, Extremities without ischemic changes {MS:19197::"except ***"," "}  Neurologic Cranial nerves 2-12 intact{CN:19197::" except ***"," "},  Pain and light touch intact in extremities{CN:19197::" except for decreased sensation in ***"," "}, Motor exam as listed above    Non-Invasive Vascular Imaging   B Carotid Duplex (***):   R ICA stenosis:  {Stenosis:19197::"Occluded","80-99%","60-79%","40-59%","1-39%"}  R VA: *** patent and antegrade  L ICA stenosis:  {Stenosis:19197::"Occluded","80-99%","60-79%","40-59%","1-39%"}  L VA: *** patent and antegrade   Medical Decision Making   Jeffrey Carney is a 62 y.o. male who presents with: B carotid aneurysms, R CVA, *** abdominal aortic ectasia   Based on the patient's vascular studies and examination, I have offered the patient: ***.  I discussed in depth with the patient the nature of atherosclerosis, and emphasized the importance of maximal medical management including strict control of blood pressure, blood glucose, and lipid levels, antiplatelet agents, obtaining regular exercise, and cessation of smoking.    The patient is aware that without maximal medical management the underlying atherosclerotic disease process will progress, limiting the benefit of any interventions.  The patient is currently on a statin: Lipitor.   The patient is currently on an anti-platelet: ASA and Plavix.  Thank you for allowing [place] to participate in this patient's care.   Adele Barthel, MD,  FACS [facility] Office: [phone] Pager: [phone]

## 2017-11-02 NOTE — Patient Instructions (Addendum)
Medication Instructions:  Your physician recommends that you continue on your current medications as directed. Please refer to the Current Medication list given to you today.   Labwork: none  Testing/Procedures: Your physician has requested that you have en exercise stress myoview. For further information please visit [email or web address]. Please follow instruction sheet, as given.    Follow-Up: Your physician recommends that you schedule a follow-up appointment in: as needed    Any Other Special Instructions Will Be Listed Below (If Applicable).     If you need a refill on your cardiac medications before your next appointment, please call your pharmacy.

## 2017-11-07 ENCOUNTER — Other Ambulatory Visit: Payer: Medicaid Other

## 2017-11-08 ENCOUNTER — Other Ambulatory Visit (HOSPITAL_COMMUNITY): Payer: Medicaid Other

## 2017-11-08 ENCOUNTER — Ambulatory Visit: Payer: Medicaid Other | Admitting: Vascular Surgery

## 2017-11-14 ENCOUNTER — Encounter (HOSPITAL_COMMUNITY): Payer: Medicaid Other

## 2017-11-16 ENCOUNTER — Ambulatory Visit: Payer: Medicaid Other | Admitting: Family Medicine

## 2017-11-16 ENCOUNTER — Encounter: Payer: Self-pay | Admitting: Family Medicine

## 2017-11-17 ENCOUNTER — Encounter: Payer: Self-pay | Admitting: Family Medicine

## 2018-05-01 IMAGING — US US RENAL
1 series · 14 of 25 positions shown · non-contrast
Comparison: None.

CLINICAL DATA: Acute renal failure

EXAM:
RENAL / URINARY TRACT ULTRASOUND COMPLETE

[Series 1: us renal · 0.22mm/px · 14 of 31 slices shown]
[im 1/31]
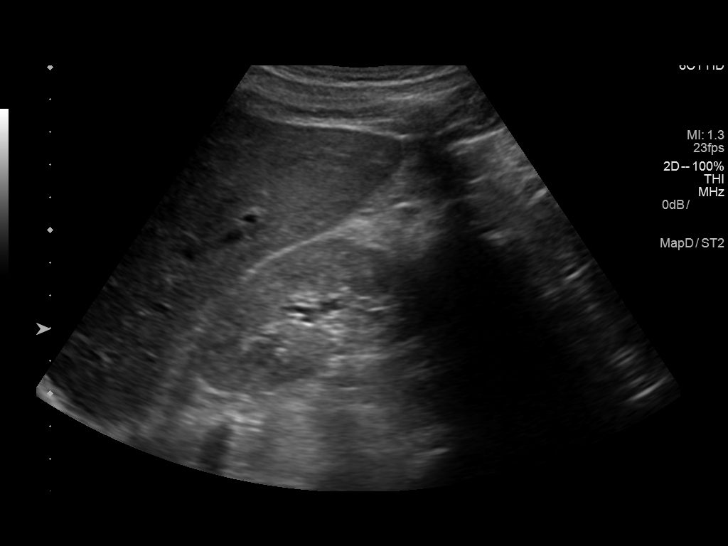
[im 3/31]
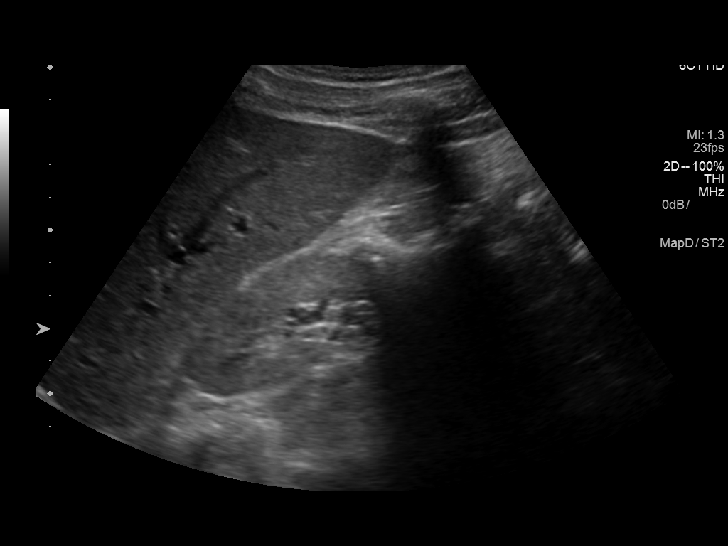
[im 6/31]
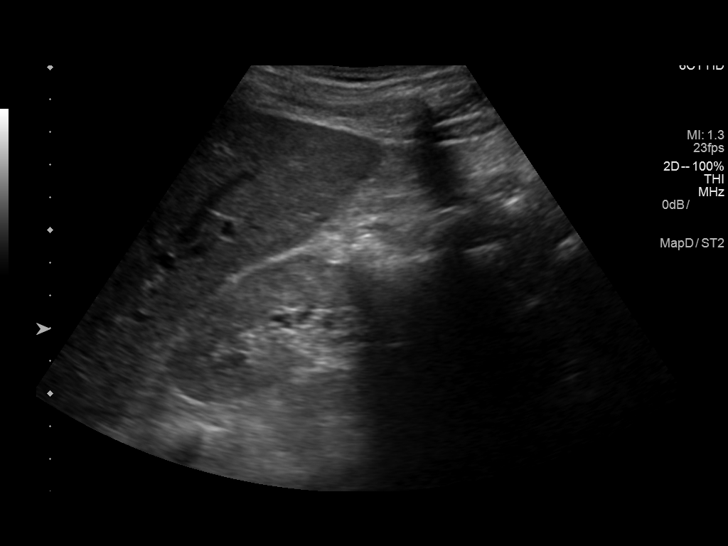
[im 8/31]
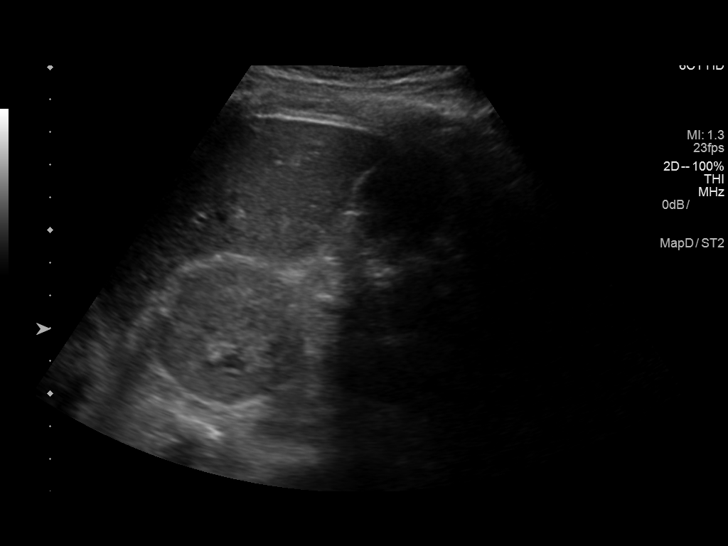
[im 11/31]
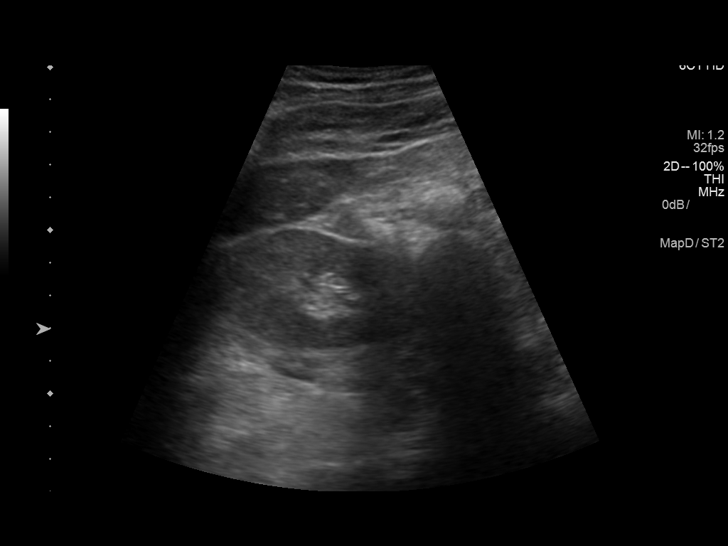
[im 12/31]
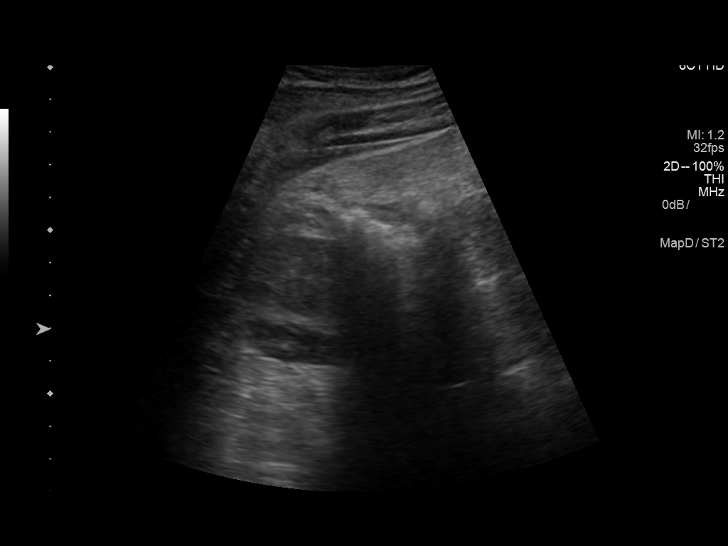
[im 14/31]
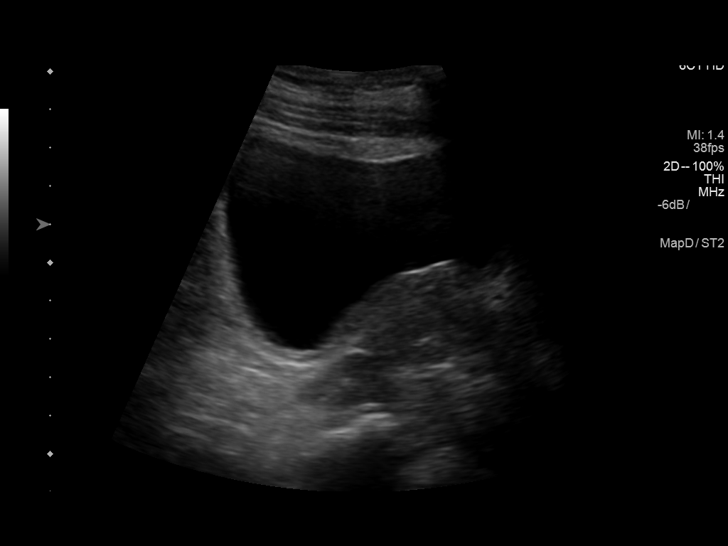
[im 17/31]
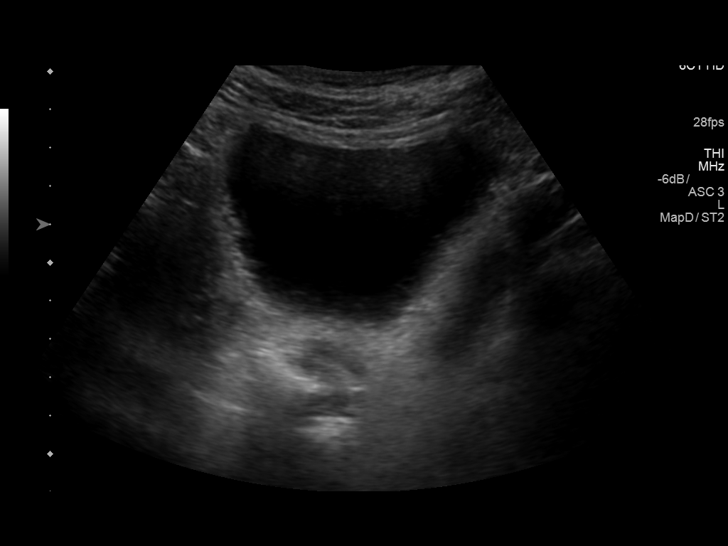
[im 19/31]
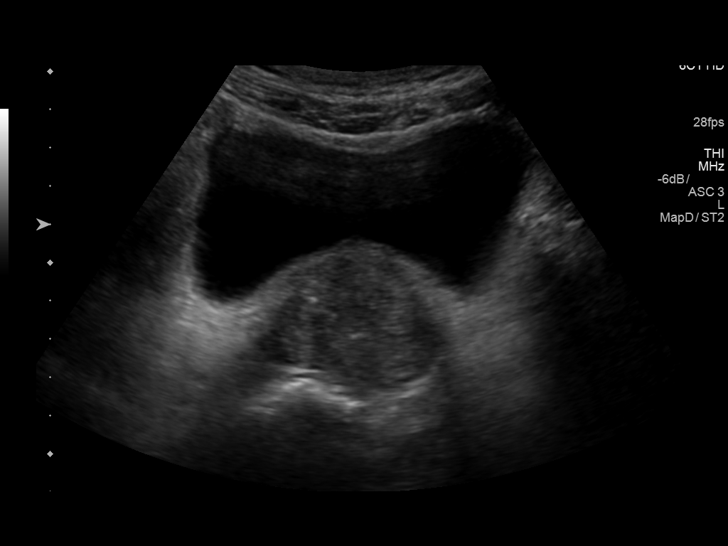
[im 21/31]
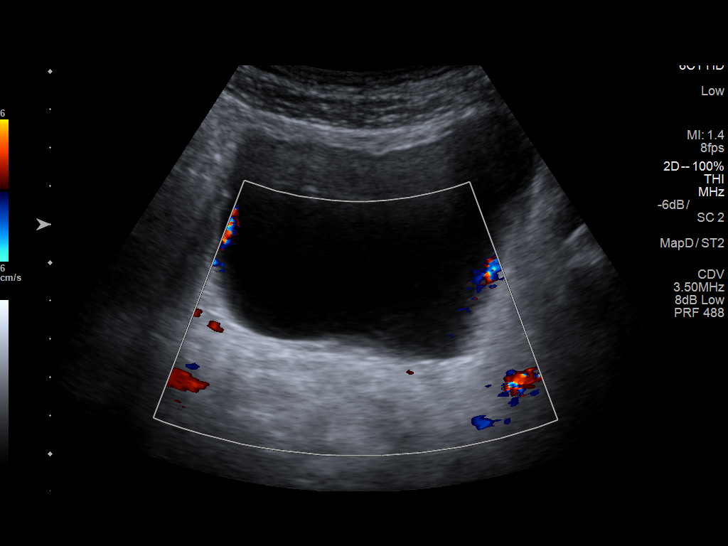
[im 23/31]
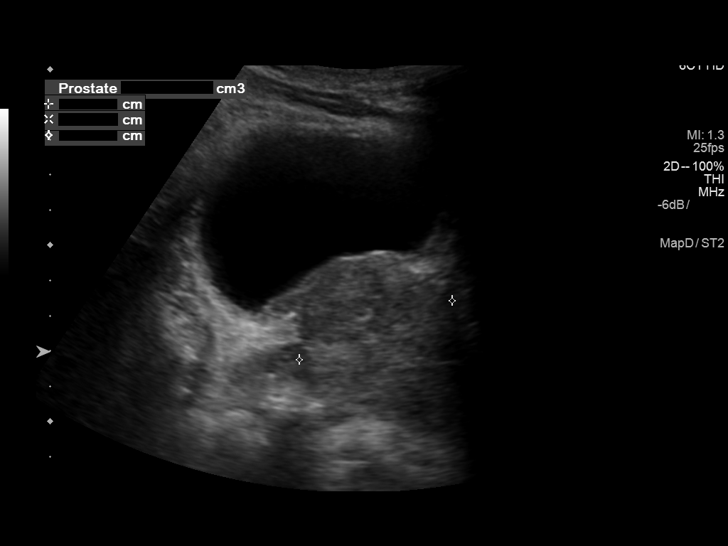
[im 26/31]
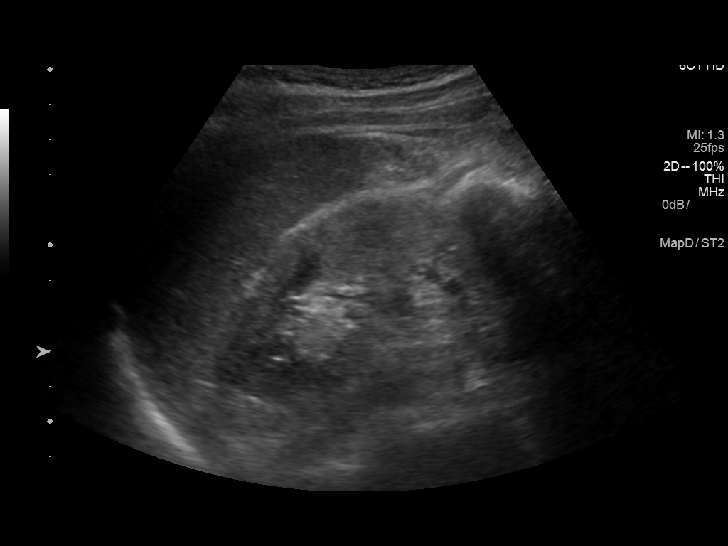
[im 28/31]
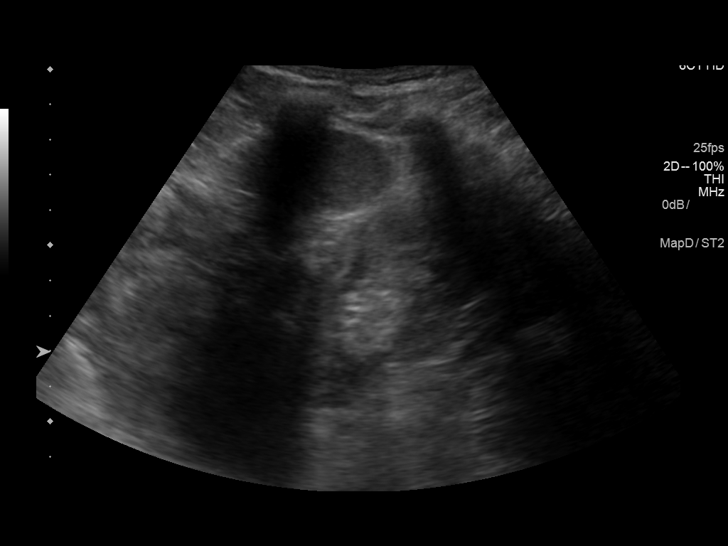
[im 31/31]
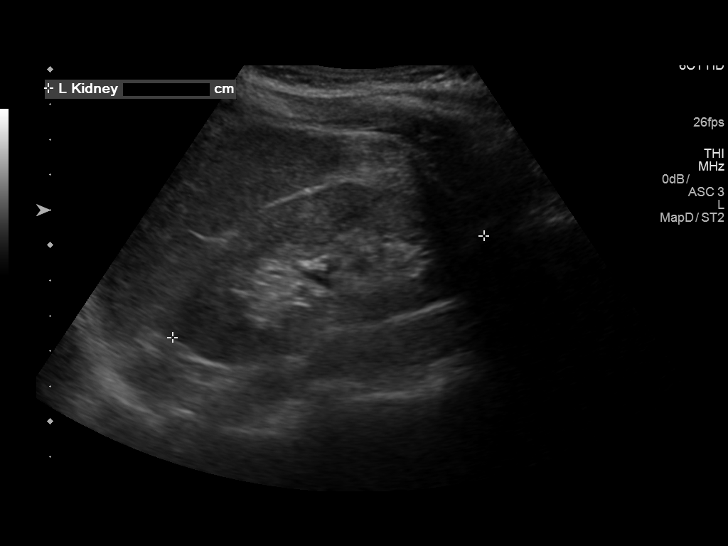

[14 of 25 positions shown; findings below may reference images not displayed]

FINDINGS: Right Kidney:

Length: 9.3 cm. Echogenicity is increased. Renal cortical thickness
is normal. No mass, perinephric fluid, or hydronephrosis visualized.
No sonographically demonstrable calculus or ureterectasis.

Left Kidney:

Length: 9.3 cm. Echogenicity is slightly increased. Renal cortical
thickness is normal. No mass, perinephric fluid, or hydronephrosis
visualized. No sonographically demonstrable calculus or
ureterectasis.

Bladder:

Appears normal for degree of bladder distention.

Prostate measures 5.0 x 3.6 x 4.7 cm with a measured volume of 44
cubic cm.
IMPRESSION: Kidneys are somewhat echogenic, a finding concerning for medical
renal disease. Renal cortical thickness normal. No obstructing focus
in either kidney.

Prominent prostate.

Study otherwise unremarkable.

## 2018-08-17 ENCOUNTER — Other Ambulatory Visit: Payer: Self-pay | Admitting: Internal Medicine

## 2018-08-17 DIAGNOSIS — N184 Chronic kidney disease, stage 4 (severe): Secondary | ICD-10-CM

## 2018-08-20 ENCOUNTER — Ambulatory Visit (HOSPITAL_COMMUNITY)
Admission: RE | Admit: 2018-08-20 | Discharge: 2018-08-20 | Disposition: A | Discharge status: Home / Self Care | Payer: Medicaid Other | Source: Ambulatory Visit | Attending: Internal Medicine | Admitting: Internal Medicine

## 2018-08-20 DIAGNOSIS — N184 Chronic kidney disease, stage 4 (severe): Secondary | ICD-10-CM | POA: Diagnosis not present

## 2018-08-22 ENCOUNTER — Ambulatory Visit (HOSPITAL_COMMUNITY): Payer: Medicaid Other

## 2018-09-11 ENCOUNTER — Other Ambulatory Visit (HOSPITAL_COMMUNITY)
Admission: RE | Admit: 2018-09-11 | Discharge: 2018-09-11 | Disposition: A | Discharge status: Home / Self Care | Payer: Medicaid Other | Source: Ambulatory Visit | Attending: Nephrology | Admitting: Nephrology

## 2018-09-11 ENCOUNTER — Other Ambulatory Visit: Payer: Self-pay

## 2018-09-11 DIAGNOSIS — N183 Chronic kidney disease, stage 3 (moderate): Secondary | ICD-10-CM | POA: Insufficient documentation

## 2018-09-11 DIAGNOSIS — E559 Vitamin D deficiency, unspecified: Secondary | ICD-10-CM | POA: Insufficient documentation

## 2018-09-11 DIAGNOSIS — R809 Proteinuria, unspecified: Secondary | ICD-10-CM | POA: Diagnosis present

## 2018-09-11 DIAGNOSIS — D509 Iron deficiency anemia, unspecified: Secondary | ICD-10-CM | POA: Insufficient documentation

## 2018-09-11 DIAGNOSIS — Z79899 Other long term (current) drug therapy: Secondary | ICD-10-CM | POA: Diagnosis not present

## 2018-09-11 LAB — RENAL FUNCTION PANEL
Albumin: 3.9 g/dL (ref 3.5–5.0)
Anion gap: 8 (ref 5–15)
BUN: 23 mg/dL (ref 8–23)
CALCIUM: 9.3 mg/dL (ref 8.9–10.3)
CO2: 26 mmol/L (ref 22–32)
Chloride: 104 mmol/L (ref 98–111)
Creatinine, Ser: 1.57 mg/dL — ABNORMAL HIGH (ref 0.61–1.24)
GFR calc non Af Amer: 46 mL/min — ABNORMAL LOW (ref >60)
GFR, EST AFRICAN AMERICAN: 53 mL/min — AB (ref >60)
Glucose, Bld: 111 mg/dL — ABNORMAL HIGH (ref 70–99)
Phosphorus: 2.1 mg/dL — ABNORMAL LOW (ref 2.5–4.6)
Potassium: 3.7 mmol/L (ref 3.5–5.1)
Sodium: 138 mmol/L (ref 135–145)

## 2018-09-11 LAB — VITAMIN B12: Vitamin B-12: 259 pg/mL (ref 180–914)

## 2018-09-11 LAB — CREATININE CLEARANCE, URINE, 24 HOUR
COLLECTION INTERVAL-CRCL: 24 h
Creatinine Clearance: 38 mL/min — ABNORMAL LOW (ref 75–125)
Creatinine, 24H Ur: 848 mg/d (ref 800–2000)
Creatinine, Urine: 80.78 mg/dL
URINE TOTAL VOLUME-CRCL: 1050 mL

## 2018-09-11 LAB — HEMOGLOBIN AND HEMATOCRIT, BLOOD
HEMATOCRIT: 45.4 % (ref 39.0–52.0)
Hemoglobin: 14.5 g/dL (ref 13.0–17.0)

## 2018-09-11 LAB — IRON AND TIBC
Iron: 47 ug/dL (ref 45–182)
Saturation Ratios: 22 % (ref 17.9–39.5)
TIBC: 216 ug/dL — ABNORMAL LOW (ref 250–450)
UIBC: 169 ug/dL

## 2018-09-11 LAB — FOLATE: Folate: 3.9 ng/mL — ABNORMAL LOW (ref >5.9)

## 2018-09-11 LAB — FERRITIN: Ferritin: 264 ng/mL (ref 24–336)

## 2018-09-12 LAB — MPO/PR-3 (ANCA) ANTIBODIES: ANCA Proteinase 3: <3.5 U/mL (ref 0.0–3.5)

## 2018-09-12 LAB — IMMUNOFIXATION ELECTROPHORESIS
IgA: 528 mg/dL — ABNORMAL HIGH (ref 61–437)
IgG (Immunoglobin G), Serum: 1831 mg/dL — ABNORMAL HIGH (ref 603–1613)
IgM (Immunoglobulin M), Srm: 39 mg/dL (ref 20–172)
Total Protein ELP: 7.3 g/dL (ref 6.0–8.5)

## 2018-09-12 LAB — UPEP/UIFE/LIGHT CHAINS/TP, 24-HR UR
% BETA, Urine: 19.8 %
ALPHA 1 URINE: 3.6 %
Albumin, U: 57.3 %
Alpha 2, Urine: 7.6 %
Free Kappa Lt Chains,Ur: 44.26 mg/L (ref 0.63–113.79)
Free Kappa/Lambda Ratio: 11.83 (ref 1.03–31.76)
Free Lambda Lt Chains,Ur: 3.74 mg/L (ref 0.47–11.77)
GAMMA GLOBULIN URINE: 11.7 %
Total Protein, Urine-Ur/day: 64 mg/24 hr (ref 30–150)
Total Protein, Urine: 6.1 mg/dL
Total Volume: 1050

## 2018-09-12 LAB — PROTEIN ELECTROPHORESIS, SERUM
A/G Ratio: 1 (ref 0.7–1.7)
Albumin ELP: 3.7 g/dL (ref 2.9–4.4)
Alpha-1-Globulin: 0.2 g/dL (ref 0.0–0.4)
Alpha-2-Globulin: 0.7 g/dL (ref 0.4–1.0)
Beta Globulin: 0.9 g/dL (ref 0.7–1.3)
Gamma Globulin: 1.7 g/dL (ref 0.4–1.8)
Globulin, Total: 3.6 g/dL (ref 2.2–3.9)
Total Protein ELP: 7.3 g/dL (ref 6.0–8.5)

## 2018-09-12 LAB — KAPPA/LAMBDA LIGHT CHAINS
Kappa free light chain: 52.2 mg/L — ABNORMAL HIGH (ref 3.3–19.4)
Kappa, lambda light chain ratio: 1.43 (ref 0.26–1.65)
Lambda free light chains: 36.5 mg/L — ABNORMAL HIGH (ref 5.7–26.3)

## 2018-09-12 LAB — COMPLEMENT, TOTAL: Compl, Total (CH50): >60 U/mL (ref >41)

## 2018-09-12 LAB — HEPATITIS B SURFACE ANTIGEN: Hepatitis B Surface Ag: NEGATIVE

## 2018-09-12 LAB — C3 COMPLEMENT: C3 Complement: 110 mg/dL (ref 82–167)

## 2018-09-12 LAB — HEPATITIS C ANTIBODY: HCV Ab: <0.1 s/co ratio (ref 0.0–0.9)

## 2018-09-12 LAB — PARATHYROID HORMONE, INTACT (NO CA): PTH: 25 pg/mL (ref 15–65)

## 2018-09-12 LAB — C4 COMPLEMENT: Complement C4, Body Fluid: 22 mg/dL (ref 14–44)

## 2018-09-12 LAB — ANTI-DNA ANTIBODY, DOUBLE-STRANDED: ds DNA Ab: 3 IU/mL (ref 0–9)

## 2018-09-12 LAB — ANA: Anti Nuclear Antibody (ANA): NEGATIVE

## 2018-09-12 LAB — VITAMIN D 25 HYDROXY (VIT D DEFICIENCY, FRACTURES): Vit D, 25-Hydroxy: 25.2 ng/mL — ABNORMAL LOW (ref 30.0–100.0)

## 2018-09-13 LAB — MISC LABCORP TEST (SEND OUT): LABCORP TEST CODE: 162045

## 2019-11-12 DEATH — deceased

## 2021-02-28 IMAGING — US RENAL/URINARY TRACT ULTRASOUND
1 series · 14 of 25 positions shown · non-contrast
Comparison: 08/04/2017

CLINICAL DATA: Chronic renal disease

EXAM:
RENAL / URINARY TRACT ULTRASOUND COMPLETE

[Series 1: renal/urinary tract ultrasound · 0.19mm/px · 14 of 64 slices shown]
[im 1/64]
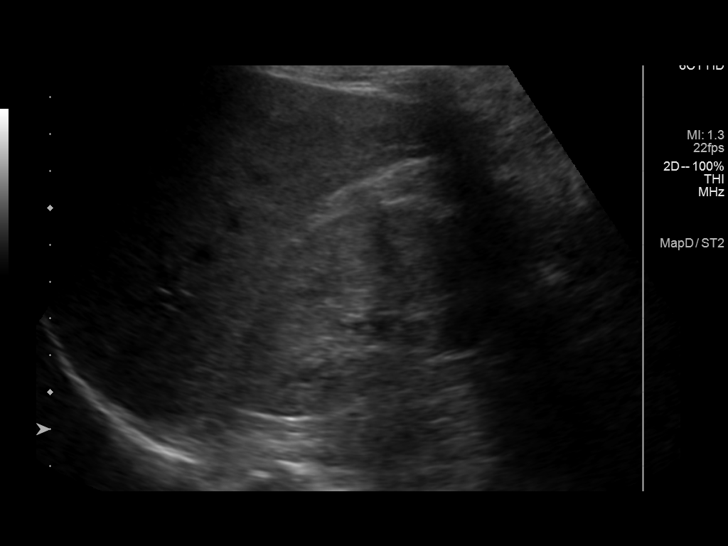
[im 6/64]
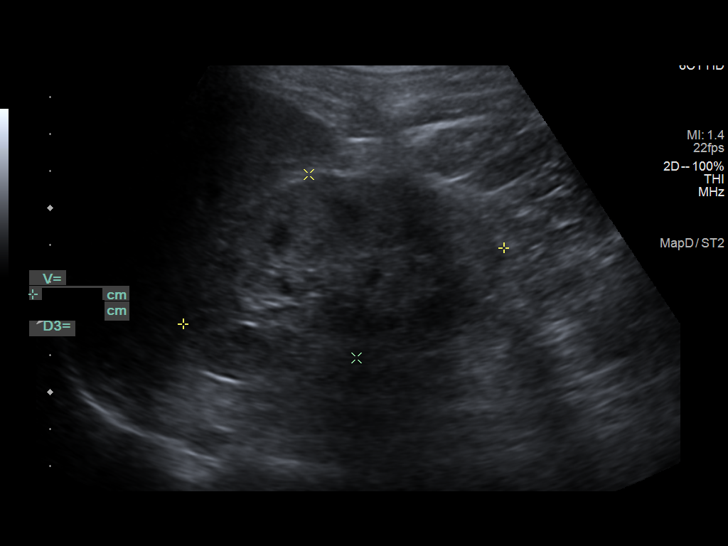
[im 11/64]
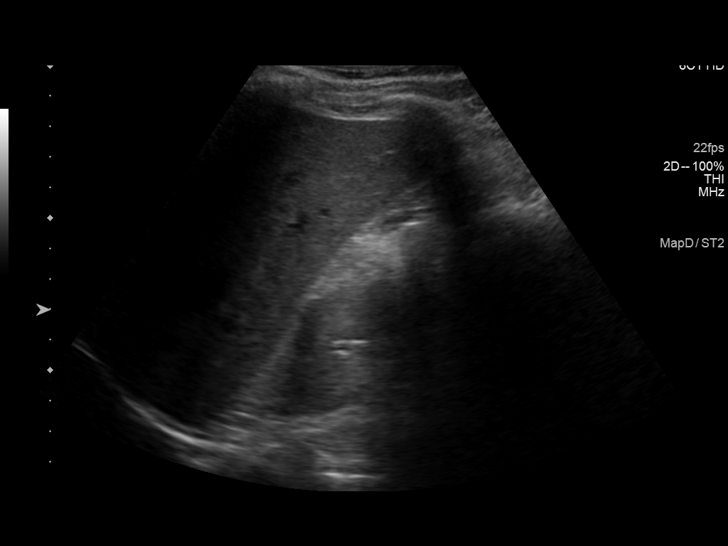
[im 16/64]
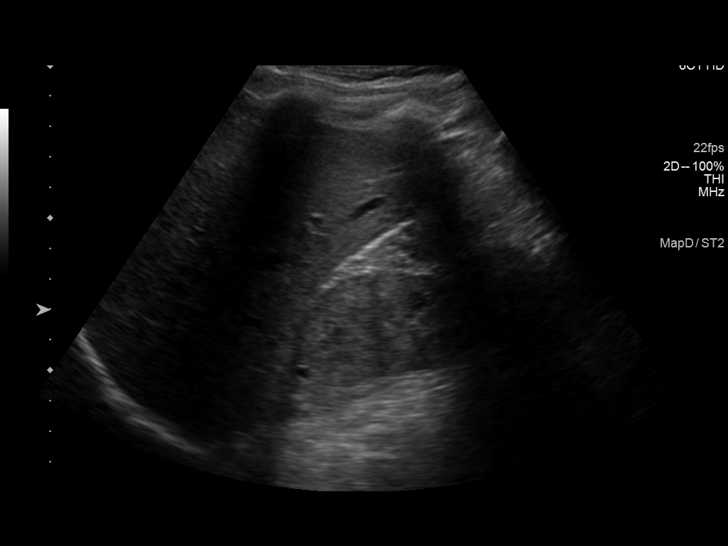
[im 22/64]
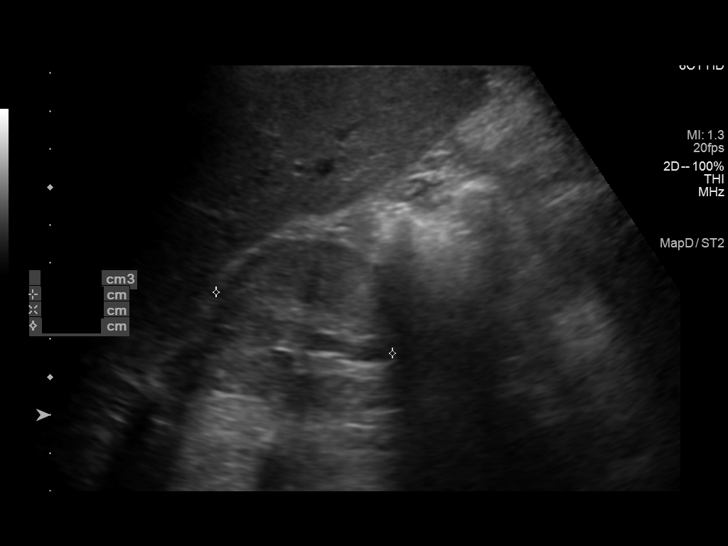
[im 24/64]
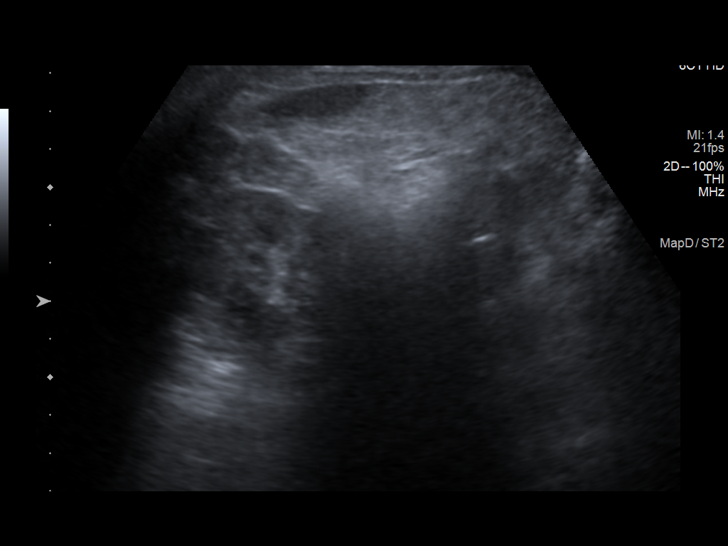
[im 29/64]
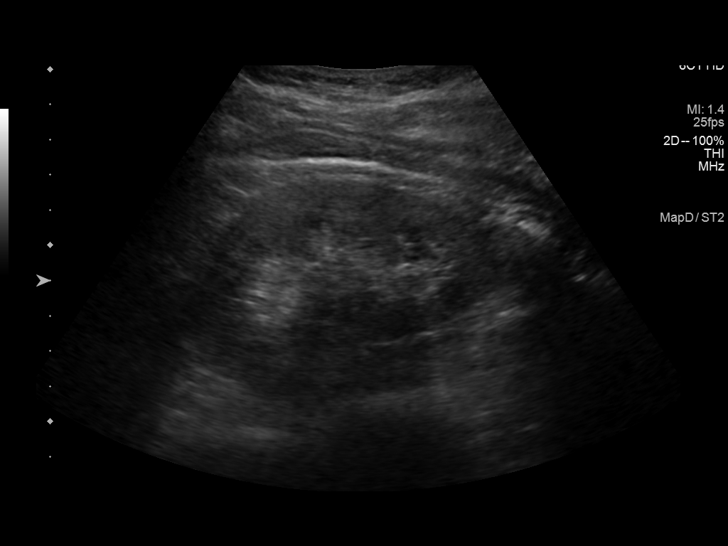
[im 35/64]
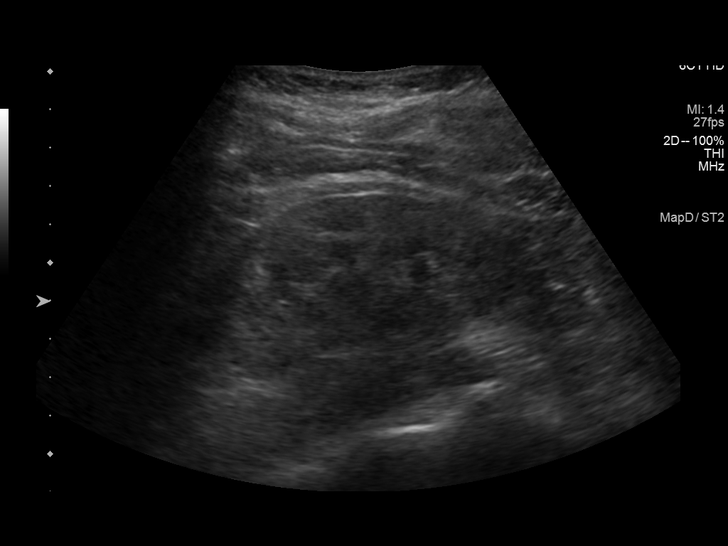
[im 40/64]
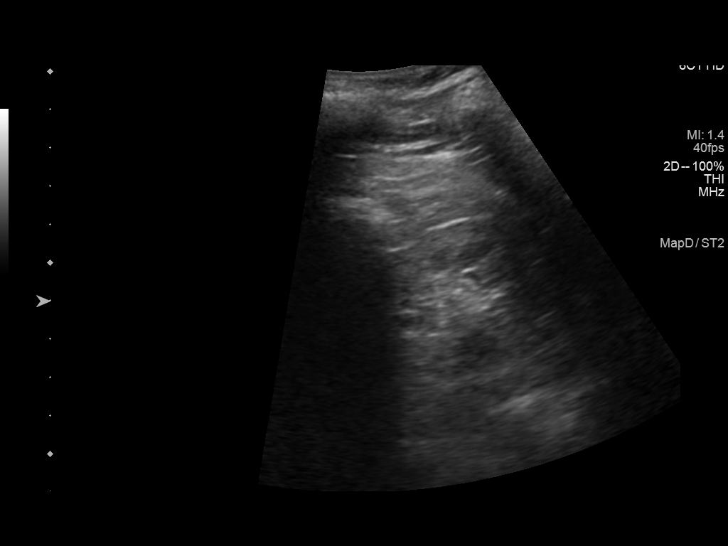
[im 43/64]
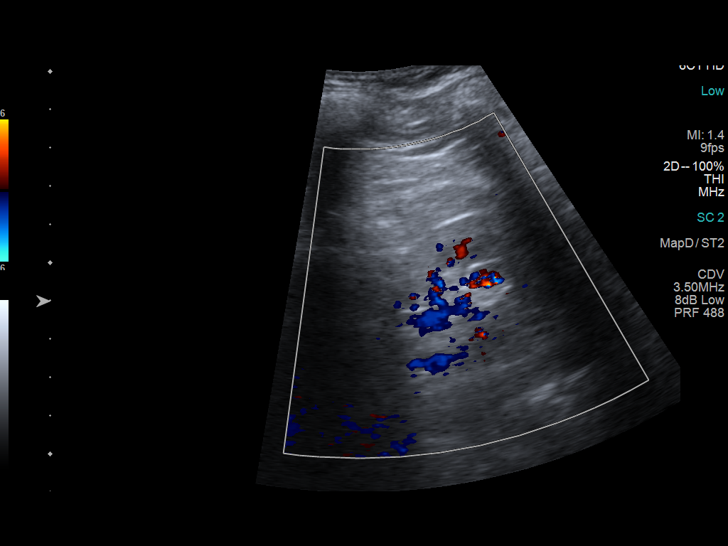
[im 48/64]
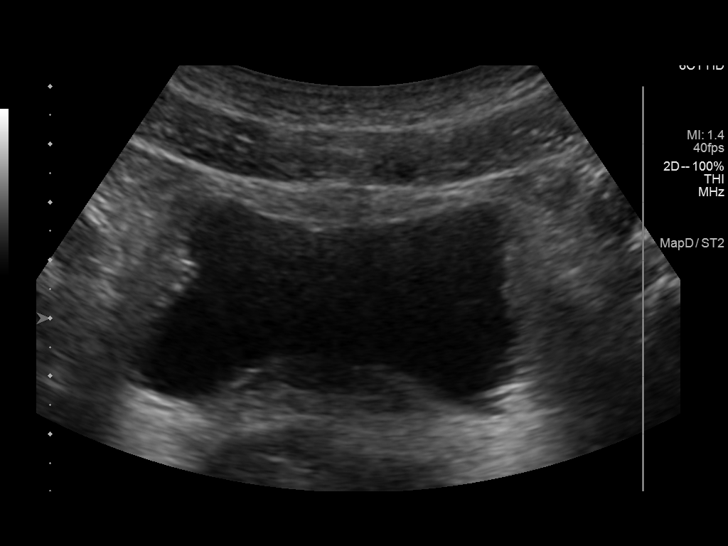
[im 53/64]
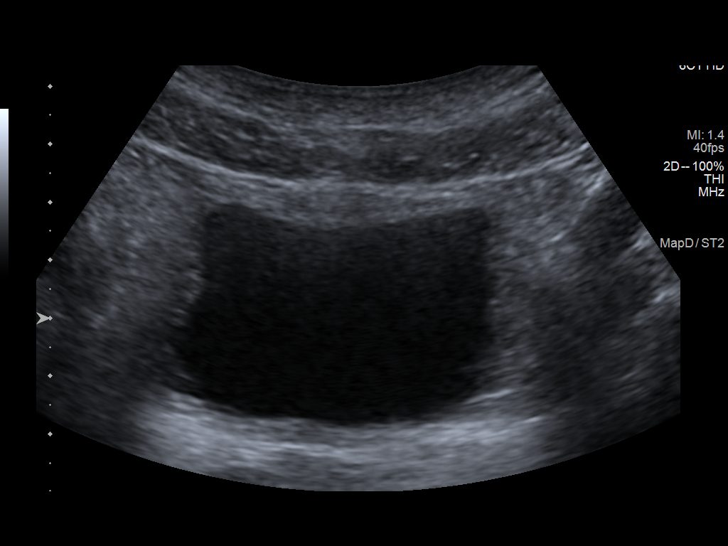
[im 58/64]
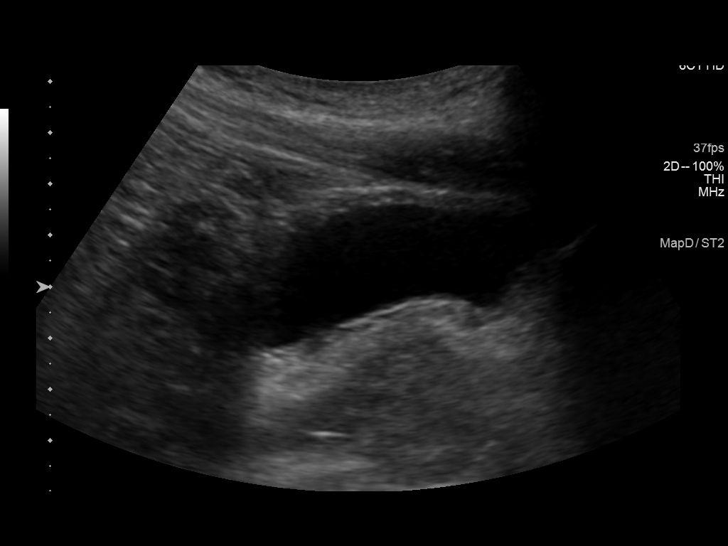
[im 64/64]
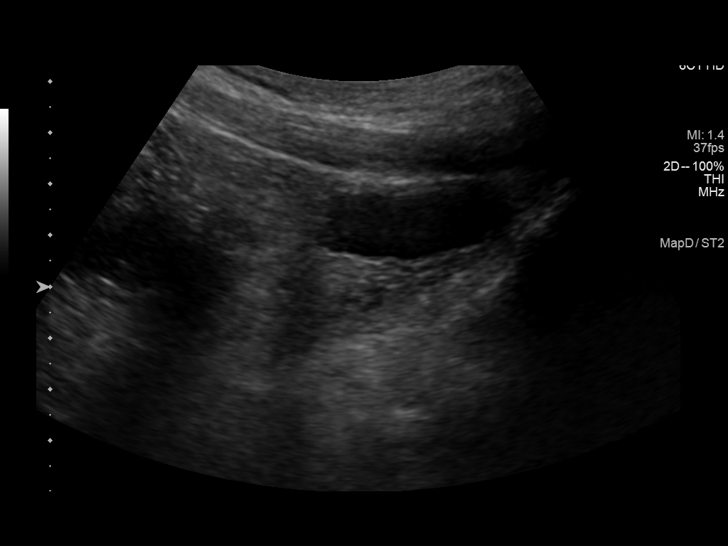

[14 of 25 positions shown; findings below may reference images not displayed]

FINDINGS: Right Kidney:

Renal measurements: 8.9 x 5.1 x 4.9 cm = volume: 118 mL. Increased
cortical echogenicity. No mass or hydronephrosis visualized.

Left Kidney:

Renal measurements: 9.8 x 4.8 x 4.8 cm = volume: 118 mL. Increased
cortical echogenicity. No mass or hydronephrosis visualized.

Bladder:

Appears normal for degree of bladder distention.

Enlarged prostate gland.
IMPRESSION: Increased cortical echogenicity of the kidneys consistent with
medical renal disease

Enlarged prostate gland.  Please correlate to serum PSA values.
